# Patient Record
Sex: Female | Born: 1955
Health system: Southern US, Community
[De-identification: ages and names within clinical notes are randomized; demographics above are authoritative.]

## PROBLEM LIST (undated history)

## (undated) ENCOUNTER — Emergency Department (HOSPITAL_COMMUNITY): Admission: EM | Payer: 59

## (undated) DIAGNOSIS — J45909 Unspecified asthma, uncomplicated: Secondary | ICD-10-CM

## (undated) DIAGNOSIS — C569 Malignant neoplasm of unspecified ovary: Secondary | ICD-10-CM

## (undated) HISTORY — DX: Unspecified asthma, uncomplicated: J45.909

## (undated) HISTORY — PX: ABDOMINAL HYSTERECTOMY: SHX81

## (undated) HISTORY — PX: BREAST LUMPECTOMY: SHX2

## (undated) HISTORY — DX: Malignant neoplasm of unspecified ovary: C56.9

---

## 1997-08-19 HISTORY — PX: PARTIAL HYSTERECTOMY: SHX80

## 1998-01-26 ENCOUNTER — Ambulatory Visit (HOSPITAL_COMMUNITY): Admission: RE | Admit: 1998-01-26 | Discharge: 1998-01-26 | Payer: Self-pay | Admitting: Emergency Medicine

## 1998-03-01 ENCOUNTER — Other Ambulatory Visit: Admission: RE | Admit: 1998-03-01 | Discharge: 1998-03-01 | Payer: Self-pay | Admitting: Obstetrics and Gynecology

## 1998-03-02 ENCOUNTER — Ambulatory Visit (HOSPITAL_COMMUNITY): Admission: RE | Admit: 1998-03-02 | Discharge: 1998-03-02 | Payer: Self-pay | Admitting: Obstetrics and Gynecology

## 1998-03-23 ENCOUNTER — Inpatient Hospital Stay (HOSPITAL_COMMUNITY): Admission: RE | Admit: 1998-03-23 | Discharge: 1998-03-26 | Payer: Self-pay | Admitting: Obstetrics and Gynecology

## 1999-03-29 ENCOUNTER — Other Ambulatory Visit: Admission: RE | Admit: 1999-03-29 | Discharge: 1999-03-29 | Payer: Self-pay | Admitting: Obstetrics and Gynecology

## 1999-03-30 ENCOUNTER — Encounter (INDEPENDENT_AMBULATORY_CARE_PROVIDER_SITE_OTHER): Payer: Self-pay

## 1999-03-30 ENCOUNTER — Other Ambulatory Visit: Admission: RE | Admit: 1999-03-30 | Discharge: 1999-03-30 | Payer: Self-pay | Admitting: Obstetrics and Gynecology

## 2008-09-03 ENCOUNTER — Emergency Department (HOSPITAL_COMMUNITY): Admission: EM | Admit: 2008-09-03 | Discharge: 2008-09-03 | Payer: Self-pay | Admitting: Family Medicine

## 2009-03-30 ENCOUNTER — Emergency Department (HOSPITAL_COMMUNITY): Admission: EM | Admit: 2009-03-30 | Discharge: 2009-03-30 | Payer: Self-pay | Admitting: *Deleted

## 2009-05-30 LAB — HM COLONOSCOPY

## 2018-09-17 ENCOUNTER — Ambulatory Visit: Payer: Self-pay | Admitting: Family Medicine

## 2019-07-24 ENCOUNTER — Other Ambulatory Visit: Payer: Self-pay

## 2019-07-24 ENCOUNTER — Ambulatory Visit
Admission: EM | Admit: 2019-07-24 | Discharge: 2019-07-24 | Disposition: A | Discharge status: Home / Self Care | Payer: 59

## 2019-07-24 ENCOUNTER — Encounter: Payer: Self-pay | Admitting: Emergency Medicine

## 2019-07-24 DIAGNOSIS — R05 Cough: Secondary | ICD-10-CM

## 2019-07-24 DIAGNOSIS — R030 Elevated blood-pressure reading, without diagnosis of hypertension: Secondary | ICD-10-CM | POA: Diagnosis not present

## 2019-07-24 DIAGNOSIS — R053 Chronic cough: Secondary | ICD-10-CM

## 2019-07-24 NOTE — ED Provider Notes (Signed)
[facility]    CSN: [id number] Arrival date & time: 07/24/19  0901      History   Chief Complaint Chief Complaint  Patient presents with  . Cough    HPI Rhonda Kirk is a 63 y.o. 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  Patient states is slowly been worsening with the weather change.  Patient does endorse history of seasonal allergies: Not currently taking anything for this.  Patient denies antihypertensive use, change in medications or lifestyle.  Patient does state that there has been mold in the building that they have taking care of numerous times.  Patient does wear a mask while at work.   Past Medical History:  Diagnosis Date  . Cancer ([place])    ovarian    There are no active problems to display for this patient.   Past Surgical History:  Procedure Laterality Date  . ABDOMINAL HYSTERECTOMY    . BREAST LUMPECTOMY      OB History   No obstetric history on file.      Home Medications    Prior to Admission medications   Not on File    Family History Family History  Problem Relation Age of Onset  . Hypertension Mother   . Diabetes Father     Social History Social History   Tobacco Use  . Smoking status: Never Smoker  . Smokeless tobacco: Never Used  Substance Use Topics  . Alcohol use: Never    Frequency: Never  . Drug use: Never     Allergies   Shellfish allergy and Benadryl [diphenhydramine]   Review of Systems Review of Systems  Constitutional: Negative for fatigue and fever.  HENT: Negative for ear pain, sinus pain, sore throat and voice change.   Eyes: Negative for pain, redness and visual disturbance.  Respiratory: Positive for cough. Negative for chest tightness, shortness of breath, wheezing and stridor.   Cardiovascular: Negative for chest pain and palpitations.  Gastrointestinal: Negative for abdominal pain, diarrhea and vomiting.   Musculoskeletal: Negative for arthralgias and myalgias.  Skin: Negative for rash and wound.  Neurological: Negative for syncope and headaches.     Physical Exam Triage Vital Signs ED Triage Vitals  Enc Vitals Group     BP 07/24/19 0918 (!) 173/98     Pulse Rate 07/24/19 0918 72     Resp 07/24/19 0918 18     Temp 07/24/19 0918 (!) 97.3 F (36.3 C)     Temp Source 07/24/19 0918 Temporal     SpO2 07/24/19 0918 96 %     Weight --      Height --      Head Circumference --      Peak Flow --      Pain Score 07/24/19 0919 2     Pain Loc --      Pain Edu? --      Excl. in [place]? --    No data found.  Updated Vital Signs BP (!) 158/93 (BP Location: Left Arm)   Pulse 72   Temp (!) 97.3 F (36.3 C) (Temporal)   Resp 18   SpO2 96%   Visual Acuity Right Eye Distance:   Left Eye Distance:   Bilateral Distance:    Right Eye Near:   Left Eye Near:    Bilateral Near:     Physical Exam Constitutional:      General: She is not  in acute distress.    Appearance: She is obese. She is not ill-appearing.  HENT:     Head: Normocephalic and atraumatic.     Mouth/Throat:     Mouth: Mucous membranes are moist.     Pharynx: Oropharynx is clear. No oropharyngeal exudate or posterior oropharyngeal erythema.  Eyes:     General: No scleral icterus.    Conjunctiva/sclera: Conjunctivae normal.     Pupils: Pupils are equal, round, and reactive to light.  Neck:     Musculoskeletal: No muscular tenderness.     Comments: Trachea midline, negative JVD Cardiovascular:     Rate and Rhythm: Normal rate and regular rhythm.     Heart sounds: No murmur. No gallop.   Pulmonary:     Effort: Pulmonary effort is normal. No respiratory distress.     Breath sounds: No wheezing, rhonchi or rales.     Comments: Good air entry bilaterally without prolonged expiratory phase Musculoskeletal:     Right lower leg: No edema.     Left lower leg: No edema.  Lymphadenopathy:     Cervical: No cervical  adenopathy.  Skin:    Capillary Refill: Capillary refill takes less than 2 seconds.     Coloration: Skin is not jaundiced or pale.  Neurological:     General: No focal deficit present.     Mental Status: She is alert and oriented to person, place, and time.      UC Treatments / Results  Labs (all labs ordered are listed, but only abnormal results are displayed) Labs Reviewed - No data to display  EKG   Radiology No results found.  Procedures Procedures (including critical care time)  Medications Ordered in UC Medications - No data to display  Initial Impression / Assessment and Plan / UC Course  I have reviewed the triage vital signs and the nursing notes.  Pertinent labs & imaging results that were available during my care of the patient were reviewed by me and considered in my medical decision making (see chart for details).     Patient afebrile, nontoxic.  Low concern for systemic fungal infection at this time.  Reviewed trigger avoidance: Patient to continue working with her employer regarding mold removal.  Patient to continue mask use, add daily antihistamine.  Patient hypertensive in office without change in vision, chest pain, shortness of breath, severe abdominal pain.  Patient has follow-up in January with PCP, will discuss progress and blood pressure at that time.  Return precautions discussed, patient verbalized understanding and is agreeable to plan. Final Clinical Impressions(s) / UC Diagnoses   Final diagnoses:  Elevated blood pressure reading in office without diagnosis of hypertension  Chronic cough     Discharge Instructions     Take daytime allergy medication every day. Continue monitoring symptoms and follow-up with PCP. Return for worsening cough, productive cough, chest pain, fever.    ED Prescriptions    None     PDMP not reviewed this encounter.   Hall[place]Potvin, [place], [place] 07/24/19 1015

## 2019-07-24 NOTE — Discharge Instructions (Signed)
Take daytime allergy medication every day. Continue monitoring symptoms and follow-up with PCP. Return for worsening cough, productive cough, chest pain, fever.

## 2019-07-24 NOTE — ED Triage Notes (Signed)
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  Coughs shortly after entering the building, and seems to improve when she goes home.  States the building has mold.

## 2019-07-24 NOTE — ED Notes (Signed)
Patient able to ambulate independently  

## 2019-08-20 HISTORY — PX: COLONOSCOPY: SHX174

## 2019-08-20 HISTORY — PX: DIAGNOSTIC MAMMOGRAM: HXRAD719

## 2019-08-25 ENCOUNTER — Ambulatory Visit (INDEPENDENT_AMBULATORY_CARE_PROVIDER_SITE_OTHER): Payer: 59 | Admitting: Nurse Practitioner

## 2019-08-25 ENCOUNTER — Other Ambulatory Visit: Payer: Self-pay

## 2019-08-25 ENCOUNTER — Other Ambulatory Visit: Payer: 59

## 2019-08-25 ENCOUNTER — Encounter: Payer: Self-pay | Admitting: Nurse Practitioner

## 2019-08-25 DIAGNOSIS — R053 Chronic cough: Secondary | ICD-10-CM

## 2019-08-25 DIAGNOSIS — R05 Cough: Secondary | ICD-10-CM | POA: Diagnosis not present

## 2019-08-25 DIAGNOSIS — H5789 Other specified disorders of eye and adnexa: Secondary | ICD-10-CM | POA: Diagnosis not present

## 2019-08-25 DIAGNOSIS — Z1159 Encounter for screening for other viral diseases: Secondary | ICD-10-CM

## 2019-08-25 DIAGNOSIS — Z130 Encounter for screening for diseases of the blood and blood-forming organs and certain disorders involving the immune mechanism: Secondary | ICD-10-CM

## 2019-08-25 DIAGNOSIS — Z7689 Persons encountering health services in other specified circumstances: Secondary | ICD-10-CM

## 2019-08-25 DIAGNOSIS — Z1231 Encounter for screening mammogram for malignant neoplasm of breast: Secondary | ICD-10-CM

## 2019-08-25 DIAGNOSIS — Z1329 Encounter for screening for other suspected endocrine disorder: Secondary | ICD-10-CM

## 2019-08-25 DIAGNOSIS — Z131 Encounter for screening for diabetes mellitus: Secondary | ICD-10-CM

## 2019-08-25 DIAGNOSIS — Z13220 Encounter for screening for lipoid disorders: Secondary | ICD-10-CM

## 2019-08-25 DIAGNOSIS — Z114 Encounter for screening for human immunodeficiency virus [HIV]: Secondary | ICD-10-CM

## 2019-08-25 NOTE — Progress Notes (Signed)
Patient here for previsit labs.

## 2019-08-25 NOTE — Progress Notes (Signed)
Virtual Visit via Telephone Note Due to national recommendations of social distancing due to [place] 19, telehealth visit is felt to be most appropriate for this patient at this time.  I discussed the limitations, risks, security and privacy concerns of performing an evaluation and management service by telephone and the availability of in person appointments. I also discussed with the patient that there may be a patient responsible charge related to this service. The patient expressed understanding and agreed to proceed.    I connected with Rhonda Kirk on 08/25/19  at   2:50 PM EST  EDT by telephone and verified that I am speaking with the correct person using two identifiers.   Consent I discussed the limitations, risks, security and privacy concerns of performing an evaluation and management service by telephone and the availability of in person appointments. I also discussed with the patient that there may be a patient responsible charge related to this service. The patient expressed understanding and agreed to proceed.   Location of Patient: Private Residence   Location of Provider: [place] and [employer] Office    Persons participating in Telemedicine visit: Geryl Rankins FNP-BC [place]    History of Present Illness: Telemedicine visit for: Establish Care  has a past medical history of Ovarian cancer ([place]).   Health Maintenance PAP: S/P Hysterectomy over 20 years ago Mammogram: has not had a mammogram "in years".  Colonoscopy: Declines. States she has had 2 already and does not want to repeat   Cough: Patient complains of nonproductive cough.  Symptoms began 1 year ago.  The cough is non-productive, without wheezing, dyspnea or hemoptysis and is aggravated by nothing Associated symptoms include:watery, itchy eyes. Patient does not have new pets. Patient does not have a history of asthma. Patient does have a history of environmental allergens.  Patient does not have recent travel. Patient does not have a history of smoking. Patient  does not have previous Chest X-ray. Endorses chronic cough for over one year. Was started on zyrtec which she took for 2 weeks with little relief  and then switched over to Claritin. Cough has improved however still not resolved completely.  Dry cough. She works for [employer] in a very old [employer].  States 2 offices on her floor have been "deep cleaned" for mold. Feels she may have been exposed to mold which is causing her cough.       Past Medical History:  Diagnosis Date  . Ovarian cancer [facility])     Past Surgical History:  Procedure Laterality Date  . ABDOMINAL HYSTERECTOMY    . BREAST LUMPECTOMY      Family History  Problem Relation Age of Onset  . Hypertension Mother   . Diabetes Father     Social History   Socioeconomic History  . Marital status: Married    Spouse name: Not on file  . Number of children: Not on file  . Years of education: Not on file  . Highest education level: Not on file  Occupational History  . Not on file  Tobacco Use  . Smoking status: Never Smoker  . Smokeless tobacco: Never Used  Substance and Sexual Activity  . Alcohol use: Never  . Drug use: Never  . Sexual activity: Not on file  Other Topics Concern  . Not on file  Social History Narrative  . Not on file   Social Determinants of Health   Financial Resource Strain:   . Difficulty of Paying Living  Expenses: Not on file  Food Insecurity:   . Worried About Charity fundraiser in the Last Year: Not on file  . Ran Out of Food in the Last Year: Not on file  Transportation Needs:   . Lack of Transportation (Medical): Not on file  . Lack of Transportation (Non-Medical): Not on file  Physical Activity:   . Days of Exercise per Week: Not on file  . Minutes of Exercise per Session: Not on file  Stress:   . Feeling of Stress : Not on file  Social Connections:   . Frequency of Communication with Friends  and Family: Not on file  . Frequency of Social Gatherings with Friends and Family: Not on file  . Attends Religious Services: Not on file  . Active Member of Clubs or Organizations: Not on file  . Attends Archivist Meetings: Not on file  . Marital Status: Not on file     Observations/Objective: Awake, alert and oriented x 3   Review of Systems  Constitutional: Negative for fever, malaise/fatigue and weight loss.  HENT: Negative.  Negative for nosebleeds.   Eyes: Negative.  Negative for blurred vision, double vision and photophobia.  Respiratory: Positive for cough. Negative for shortness of breath.   Cardiovascular: Negative.  Negative for chest pain, palpitations and leg swelling.  Gastrointestinal: Negative.  Negative for heartburn, nausea and vomiting.  Musculoskeletal: Negative.  Negative for myalgias.  Neurological: Negative.  Negative for dizziness, focal weakness, seizures and headaches.  Endo/Heme/Allergies: Positive for environmental allergies.  Psychiatric/Behavioral: Negative.  Negative for suicidal ideas.    Assessment and Plan: Rhonda Kirk was seen today for establish care and cough.  Diagnoses and all orders for this visit:  Encounter to establish care  Chronic cough -     DG Chest 2 View; Future If xray neg Work up for GERD  if no response Treat as asthma and refer to pulmonology for PFT  Breast cancer screening by mammogram -     Correct MAMMOGRAM; Future     Follow Up Instructions Return if symptoms worsen or fail to improve.     I discussed the assessment and treatment plan with the patient. The patient was provided an opportunity to ask questions and all were answered. The patient agreed with the plan and demonstrated an understanding of the instructions.   The patient was advised to call back or seek an in-person evaluation if the symptoms worsen or if the condition fails to improve as anticipated.  I provided 20 minutes of  non-face-to-face time during this encounter including median intraservice time, reviewing previous notes, labs, imaging, medications and explaining diagnosis and management.  Rhonda Pounds, FNP-BC

## 2019-08-26 LAB — CBC WITH DIFFERENTIAL/PLATELET
Basophils Absolute: 0 10*3/uL (ref 0.0–0.2)
Basos: 0 %
EOS (ABSOLUTE): 0.1 10*3/uL (ref 0.0–0.4)
Eos: 2 %
Hematocrit: 40.7 % (ref 34.0–46.6)
Hemoglobin: 13.7 g/dL (ref 11.1–15.9)
Immature Grans (Abs): 0 10*3/uL (ref 0.0–0.1)
Immature Granulocytes: 0 %
Lymphocytes Absolute: 1.8 10*3/uL (ref 0.7–3.1)
Lymphs: 34 %
MCH: 29.1 pg (ref 26.6–33.0)
MCHC: 33.7 g/dL (ref 31.5–35.7)
MCV: 86 fL (ref 79–97)
Monocytes Absolute: 0.2 10*3/uL (ref 0.1–0.9)
Monocytes: 5 %
Neutrophils Absolute: 3 10*3/uL (ref 1.4–7.0)
Neutrophils: 59 %
Platelets: 199 10*3/uL (ref 150–450)
RBC: 4.71 x10E6/uL (ref 3.77–5.28)
RDW: 12.5 % (ref 11.7–15.4)
WBC: 5.2 10*3/uL (ref 3.4–10.8)

## 2019-08-26 LAB — COMPREHENSIVE METABOLIC PANEL
ALT: 13 IU/L (ref 0–32)
AST: 19 IU/L (ref 0–40)
Albumin/Globulin Ratio: 1.6 (ref 1.2–2.2)
Albumin: 4.6 g/dL (ref 3.8–4.8)
Alkaline Phosphatase: 85 IU/L (ref 39–117)
BUN/Creatinine Ratio: 10 — ABNORMAL LOW (ref 12–28)
BUN: 8 mg/dL (ref 8–27)
Bilirubin Total: 0.3 mg/dL (ref 0.0–1.2)
CO2: 26 mmol/L (ref 20–29)
Calcium: 9.4 mg/dL (ref 8.7–10.3)
Chloride: 104 mmol/L (ref 96–106)
Creatinine, Ser: 0.82 mg/dL (ref 0.57–1.00)
GFR calc Af Amer: 89 mL/min/{1.73_m2} (ref >59)
GFR calc non Af Amer: 77 mL/min/{1.73_m2} (ref >59)
Globulin, Total: 2.8 g/dL (ref 1.5–4.5)
Glucose: 101 mg/dL — ABNORMAL HIGH (ref 65–99)
Potassium: 4.4 mmol/L (ref 3.5–5.2)
Sodium: 143 mmol/L (ref 134–144)
Total Protein: 7.4 g/dL (ref 6.0–8.5)

## 2019-08-26 LAB — HEPATITIS C ANTIBODY: Hep C Virus Ab: <0.1 s/co ratio (ref 0.0–0.9)

## 2019-08-26 LAB — LIPID PANEL
Chol/HDL Ratio: 4.2 ratio (ref 0.0–4.4)
Cholesterol, Total: 244 mg/dL — ABNORMAL HIGH (ref 100–199)
HDL: 58 mg/dL (ref >39)
LDL Chol Calc (NIH): 171 mg/dL — ABNORMAL HIGH (ref 0–99)
Triglycerides: 87 mg/dL (ref 0–149)
VLDL Cholesterol Cal: 15 mg/dL (ref 5–40)

## 2019-08-26 LAB — HEMOGLOBIN A1C
Est. average glucose Bld gHb Est-mCnc: 126 mg/dL
Hgb A1c MFr Bld: 6 % — ABNORMAL HIGH (ref 4.8–5.6)

## 2019-08-26 LAB — HIV ANTIBODY (ROUTINE TESTING W REFLEX): HIV Screen 4th Generation wRfx: NONREACTIVE

## 2019-08-26 LAB — TSH: TSH: 2.23 u[IU]/mL (ref 0.450–4.500)

## 2019-08-30 ENCOUNTER — Ambulatory Visit (HOSPITAL_COMMUNITY)
Admission: RE | Admit: 2019-08-30 | Discharge: 2019-08-30 | Disposition: A | Discharge status: Home / Self Care | Payer: 59 | Source: Ambulatory Visit | Attending: Nurse Practitioner | Admitting: Nurse Practitioner

## 2019-08-30 ENCOUNTER — Emergency Department (HOSPITAL_COMMUNITY): Admission: EM | Admit: 2019-08-30 | Discharge: 2019-08-30 | Discharge status: Absent without leave | Payer: 59

## 2019-08-30 DIAGNOSIS — R05 Cough: Secondary | ICD-10-CM | POA: Insufficient documentation

## 2019-08-30 DIAGNOSIS — R053 Chronic cough: Secondary | ICD-10-CM

## 2019-08-30 DIAGNOSIS — R918 Other nonspecific abnormal finding of lung field: Secondary | ICD-10-CM | POA: Diagnosis not present

## 2019-08-31 ENCOUNTER — Encounter: Payer: Self-pay | Admitting: Nurse Practitioner

## 2019-08-31 ENCOUNTER — Other Ambulatory Visit: Payer: Self-pay | Admitting: Nurse Practitioner

## 2019-08-31 DIAGNOSIS — R9389 Abnormal findings on diagnostic imaging of other specified body structures: Secondary | ICD-10-CM

## 2019-09-06 ENCOUNTER — Other Ambulatory Visit: Payer: Self-pay

## 2019-09-06 ENCOUNTER — Ambulatory Visit (HOSPITAL_COMMUNITY)
Admission: RE | Admit: 2019-09-06 | Discharge: 2019-09-06 | Disposition: A | Discharge status: Home / Self Care | Payer: 59 | Source: Ambulatory Visit | Attending: Nurse Practitioner | Admitting: Nurse Practitioner

## 2019-09-06 DIAGNOSIS — R9389 Abnormal findings on diagnostic imaging of other specified body structures: Secondary | ICD-10-CM | POA: Insufficient documentation

## 2019-10-20 ENCOUNTER — Encounter: Payer: Self-pay | Admitting: Nurse Practitioner

## 2019-10-20 ENCOUNTER — Ambulatory Visit
Admission: RE | Admit: 2019-10-20 | Discharge: 2019-10-20 | Disposition: A | Discharge status: Home / Self Care | Payer: 59 | Source: Ambulatory Visit | Attending: Nurse Practitioner | Admitting: Nurse Practitioner

## 2019-10-20 ENCOUNTER — Other Ambulatory Visit: Payer: Self-pay

## 2019-10-20 ENCOUNTER — Other Ambulatory Visit: Payer: Self-pay | Admitting: Nurse Practitioner

## 2019-10-20 DIAGNOSIS — Z1231 Encounter for screening mammogram for malignant neoplasm of breast: Secondary | ICD-10-CM

## 2019-10-21 ENCOUNTER — Encounter: Payer: Self-pay | Admitting: Nurse Practitioner

## 2020-01-11 ENCOUNTER — Encounter: Payer: Self-pay | Admitting: Nurse Practitioner

## 2020-01-18 ENCOUNTER — Telehealth (INDEPENDENT_AMBULATORY_CARE_PROVIDER_SITE_OTHER): Payer: 59 | Admitting: Internal Medicine

## 2020-01-18 ENCOUNTER — Encounter: Payer: Self-pay | Admitting: Internal Medicine

## 2020-01-18 DIAGNOSIS — Z1211 Encounter for screening for malignant neoplasm of colon: Secondary | ICD-10-CM | POA: Diagnosis not present

## 2020-01-18 DIAGNOSIS — R058 Other specified cough: Secondary | ICD-10-CM

## 2020-01-18 DIAGNOSIS — R05 Cough: Secondary | ICD-10-CM

## 2020-01-18 MED ORDER — ALBUTEROL SULFATE (2.5 MG/3ML) 0.083% IN NEBU: 2.5000 mg | INHALATION_SOLUTION | Freq: Four times a day (QID) | RESPIRATORY_TRACT | 1 refills | Status: DC | PRN

## 2020-01-18 MED ORDER — MONTELUKAST SODIUM 10 MG PO TABS: 10.0000 mg | ORAL_TABLET | Freq: Every day | ORAL | 3 refills | Status: DC

## 2020-01-18 NOTE — Patient Instructions (Signed)
Thank you for choosing Primary Care at [facility] to be your medical home!    Rhonda Kirk was seen by Melina Schools, DO today.   [name] primary care provider is Phill Myron, DO.   For the best care possible, you should try to see Phill Myron, DO whenever you come to the clinic.   We look forward to seeing you again soon!  If you have any questions about your visit today, please call us at [phone] or feel free to reach your primary care provider via [place].

## 2020-01-18 NOTE — Progress Notes (Signed)
Virtual Visit via Telephone Note  I connected with Rhonda Kirk, on 01/18/2020 at 10:34 AM by telephone due to the COVID-19 pandemic and verified that I am speaking with the correct person using two identifiers.   Consent: I discussed the limitations, risks, security and privacy concerns of performing an evaluation and management service by telephone and the availability of in person appointments. I also discussed with the patient that there may be a patient responsible charge related to this service. The patient expressed understanding and agreed to proceed.   Location of Patient: Home   Location of Provider: Clinic    Persons participating in Telemedicine visit: Neda Quintera [facility]le [facility] Dr. Juleen China      History of Present Illness: Patient has a chronic dry cough. She has been seen for this in Jan 2021 by Geryl Rankins, NP. Patient reports she was sure it was not acid reflux as her husband has this and she has no similar symptoms. She did try acid reflux diet for two weeks with no changes.   This all started when she started a new job 1.5 years ago. Noticed that when she walked into work she would start coughing. Reports there is mold in the building and she has coworkers that have the same symptoms. She is convinced it is related to this environmental exposure. If she goes into an office with a door it will cause her to start coughing. Her symptoms are much better when she is not in the office but do seem to be worsening when she is outside of the work place now.   She used to take Zyrtec but is now taking Claritin. Has never used an inhaler.   She has never been a smoker. Does not cough up any phlegm. Denies hemoptysis, unintentional weight changes, night sweats, fevers.    Past Medical History:  Diagnosis Date  . Ovarian cancer ([facility])    Allergies  Allergen Reactions  . Shellfish Allergy Anaphylaxis  . Benadryl [Diphenhydramine] Hives    Current Outpatient  Medications on File Prior to Visit  Medication Sig Dispense Refill  . loratadine (CLARITIN) 10 MG tablet Take 10 mg by mouth daily.    . Omega-3 Fatty Acids (FISH OIL) 1000 MG CAPS Take 2 capsules by mouth daily.     No current facility-administered medications on file prior to visit.    Observations/Objective: NAD. Speaking clearly.  Work of breathing normal.  Alert and oriented. Mood appropriate.   Assessment and Plan: 1. Nonproductive cough Certainly may be related to environmental trigger with mold exposure. Will treat with Singulair in attempt to better control allergy symptoms and give Rx for rescue inhaler as well. She does not have any known underlying lung disease and has never been a smoker, making this less likely. However, may have component of reactive asthma as well. Will refer to allergy and asthma clinic. Reviewed that she had a negative CXR in Jan 2021 ordered due to this same complaint. She has no red flag symptoms for malignancy. She is not taking an Ace inhibitor or another offending medication.  - albuterol (PROVENTIL) (2.5 MG/3ML) 0.083% nebulizer solution; Take 3 mLs (2.5 mg total) by nebulization every 6 (six) hours as needed for wheezing or shortness of breath.  Dispense: 150 mL; Refill: 1 - montelukast (SINGULAIR) 10 MG tablet; Take 1 tablet (10 mg total) by mouth at bedtime.  Dispense: 30 tablet; Refill: 3 - Ambulatory referral to Allergy  2. Colon cancer screening - Ambulatory referral to Gastroenterology  Follow Up Instructions: Allergy specialist for consultation and with PCP for routine medical care    I discussed the assessment and treatment plan with the patient. The patient was provided an opportunity to ask questions and all were answered. The patient agreed with the plan and demonstrated an understanding of the instructions.   The patient was advised to call back or seek an in-person evaluation if the symptoms worsen or if the condition fails to  improve as anticipated.     I provided 22 minutes total of non-face-to-face time during this encounter including median intraservice time, reviewing previous notes, investigations, ordering medications, medical decision making, coordinating care and patient verbalized understanding at the end of the visit.    Phill Myron, D.O. Primary Care at [facility]  01/18/2020, 10:34 AM

## 2020-01-31 ENCOUNTER — Other Ambulatory Visit: Payer: Self-pay | Admitting: Internal Medicine

## 2020-01-31 DIAGNOSIS — R053 Chronic cough: Secondary | ICD-10-CM

## 2020-02-22 LAB — COLOGUARD: Cologuard: NEGATIVE

## 2020-03-02 ENCOUNTER — Ambulatory Visit: Payer: 59 | Admitting: Allergy & Immunology

## 2020-03-02 ENCOUNTER — Encounter: Payer: Self-pay | Admitting: Allergy & Immunology

## 2020-03-02 ENCOUNTER — Other Ambulatory Visit: Payer: Self-pay

## 2020-03-02 VITALS — BP 124/82 | HR 84 | Temp 98.3°F | Resp 16 | Ht 66.0 in | Wt 194.0 lb

## 2020-03-02 DIAGNOSIS — R05 Cough: Secondary | ICD-10-CM | POA: Diagnosis not present

## 2020-03-02 DIAGNOSIS — J310 Chronic rhinitis: Secondary | ICD-10-CM

## 2020-03-02 DIAGNOSIS — R059 Cough, unspecified: Secondary | ICD-10-CM

## 2020-03-02 NOTE — Progress Notes (Signed)
NEW PATIENT  Date of Service/Encounter:  03/02/20  Referring provider: Nicolette Bang, DO   Assessment:   Cough  Non-allergic rhinitis   It is rather unclear why Rhonda Kirk is having coughing in Rhonda Kirk workplace, but patient asthma is certainly a consideration.  Rhonda Kirk does not have any sensitization to any environmental allergens based on the testing today, but I did tell Rhonda Kirk that even nonallergic patients will start to have allergic-like symptoms when environmental allergens reach a certain concentration.  This is more of an irritant reaction, but is clearly still triggered by work environment.  Rhonda Kirk is rather averse to medications, but I did get Rhonda Kirk to agree to use an albuterol inhaler to see if that helps during Rhonda Kirk acute episodes.  This particular inhaler, has a counter and a device that measures inhalation technique. I think that this will be helpful with regards to monitoring how and when Rhonda Kirk is using this device.   Plan/Recommendations:   1. Cough - Lung testing looked good and it did not really change too much with the albuterol treatment. - However, one part of your lung function did improve (FEF 25-75%), so this might be relevant.  - This points towards a diagnosis of asthma, but does not necessarily rule it in. - Given this finding as well as your symptoms, I think we are going to tentatively make a diagnosis of asthma. - We are going to start you on a an albuterol inhaler to see how much you are needing it throughout the day AND to see if this helps your symptoms at all: ProAir Digihaler (sample provided). - There is an app you can download to help you keep track of how often you are using it AND help you to be able to realize whether you are using correct inhalation technique (scan the QR code on the box) - Daily controller medication(s): NOTHING (at least not yet) - Rescue medications: ProAir Digihaler 4 puffs every 4-6 hours as needed - Asthma control goals:  * Full  participation in all desired activities (may need albuterol before activity) * Albuterol use two time or less a week on average (not counting use with activity) * Cough interfering with sleep two time or less a month * Oral steroids no more than once a year * No hospitalizations  2. Chronic rhinitis - Testing today showed: negative to the entire panel - Copy of test results provided.  - However, I do not think you are crazy!  - Anyone would have coughing fits when exposed to a certain threshold of mold spores, even if they are not allergic.  - I do not think that any allergy medication is needed at this time, but I am open to revisting this in the future if needed (i.e. your postnasal drip becomes more of an issue, etc).   3. Return in about 4 weeks (around 03/30/2020).   Subjective:   Rhonda Kirk is a 64 y.o. female presenting today for evaluation of  Chief Complaint  Patient presents with  . Cough  . Allergic Rhinitis     Rhonda Kirk has a history of the following: Patient Active Problem List   Diagnosis Date Noted  . Cough 03/02/2020  . Non-allergic rhinitis 03/02/2020    History obtained from: chart review and patient.  Rhonda Kirk was referred by Nicolette Bang, DO.     Rhonda Kirk is a 64 y.o. female presenting for an evaluation of coughing and allergies.  Rhonda Kirk reports that this can be  a deep cough and sometimes productive. There is some mold in Rhonda Kirk workplace which is how this all started. Rhonda Kirk started this job two years ago. Rhonda Kirk works at [employer] down the street. Rhonda Kirk is fine when Rhonda Kirk goes home on the weekends for the most part. But Rhonda Kirk will have some coughing on the weekends. Rhonda Kirk has noticed that Rhonda Kirk symptoms are more consistent. Rhonda Kirk does not notice a seasonal variation of the symptoms. Rhonda Kirk knows that when Rhonda Kirk is in the building for a long period of time, it is more consistent in nature. Afternoons or evenings are the worse time of the  year. Rhonda Kirk does cough at night.  Rhonda Kirk PCP did prescribe albuterol but this was a nebulizer solution. Rhonda Kirk has never had a nebulizer however and Rhonda Kirk has never been able to use it. This albuterol was prescribed last month.  Rhonda Kirk does have some emesis and allergy symptoms with exposure to shellfish. There are coworkers that microwave seafood for lunch and Rhonda Kirk ends up throwing up when smelling this. Rhonda Kirk can eat fin fish without a problem.   Rhonda Kirk does report allergic rhinitis symptoms which have worsened since Rhonda Kirk started working in this building. Rhonda Kirk has never been tested in the past and Rhonda Kirk did not have allergic rhinitise symptoms when Rhonda Kirk was very young. Rhonda Kirk would have cough and wheezing during childhood when he mother was a cigarette smoker. Rhonda Kirk did Rhonda Kirk best to avoid the kids but Rhonda Kirk eventually even stopped smoking.   Otherwise, there is no history of other atopic diseases, including food allergies, drug allergies, stinging insect allergies, eczema, urticaria or contact dermatitis. There is no significant infectious history. Vaccinations are up to date.    Past Medical History: Patient Active Problem List   Diagnosis Date Noted  . Cough 03/02/2020  . Non-allergic rhinitis 03/02/2020    Medication List:  Allergies as of 03/02/2020      Reactions   Shellfish Allergy Anaphylaxis   Benadryl [diphenhydramine] Hives      Medication List       Accurate as of March 02, 2020  5:13 PM. If you have any questions, ask your nurse or doctor.        albuterol (2.5 MG/3ML) 0.083% nebulizer solution Commonly known as: PROVENTIL Take 3 mLs (2.5 mg total) by nebulization every 6 (six) hours as needed for wheezing or shortness of breath.   Fish Oil 1000 MG Caps Take 2 capsules by mouth daily.   loratadine 10 MG tablet Commonly known as: CLARITIN Take 10 mg by mouth daily.   montelukast 10 MG tablet Commonly known as: SINGULAIR Take 1 tablet (10 mg total) by mouth at bedtime.       Birth  History: non-contributory  Developmental History: non-contributory  Past Surgical History: Past Surgical History:  Procedure Laterality Date  . ABDOMINAL HYSTERECTOMY    . BREAST LUMPECTOMY       Family History: Family History  Problem Relation Age of Onset  . Hypertension Mother   . Diabetes Mother   . Diabetes Father   . Diabetes Sister   . Hypertension Sister   . Kidney disease Sister        Double mastectomy     Social History: Maybell lives in a house that is [address] years old.  Rhonda Kirk has carpeting throughout the home.  He has gas heating and central cooling.  There are neighbor cats and dogs around the home, but not inside of the home.  Rhonda Kirk does not  have dust mite covers on Rhonda Kirk bedding.  There is no tobacco exposure.  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  He is exposed to fumes, chemicals, and dust in Rhonda Kirk work environment.  Rhonda Kirk reports that Rhonda Kirk workplace is quite moldy and dusty.   Review of Systems  Constitutional: Negative.  Negative for chills, fever, malaise/fatigue and weight loss.  HENT: Negative.  Negative for congestion, ear discharge, ear pain, sinus pain and sore throat.   Eyes: Negative for pain, discharge and redness.  Respiratory: Negative for cough, sputum production, shortness of breath and wheezing.   Cardiovascular: Negative.  Negative for chest pain and palpitations.  Gastrointestinal: Negative for abdominal pain, constipation, diarrhea, heartburn, nausea and vomiting.  Skin: Negative.  Negative for itching and rash.  Neurological: Negative for dizziness and headaches.  Endo/Heme/Allergies: Negative for environmental allergies. Does not bruise/bleed easily.       Objective:   Blood pressure 124/82, pulse 84, temperature 98.3 F (36.8 C), temperature source Temporal, resp. rate 16, height 5\' 6"  (1.676 m), weight 194 lb (88 kg), SpO2 98 %. Body  mass index is 31.31 kg/m.   Physical Exam:   Physical Exam Constitutional:      Appearance: Rhonda Kirk is well-developed.     Comments: Pleasant female. Very talkative.   HENT:     Head: Normocephalic and atraumatic.     Right Ear: Tympanic membrane, ear canal and external ear normal. No drainage, swelling or tenderness. Tympanic membrane is not injected, scarred, erythematous, retracted or bulging.     Left Ear: Tympanic membrane, ear canal and external ear normal. No drainage, swelling or tenderness. Tympanic membrane is not injected, scarred, erythematous, retracted or bulging.     Nose: No nasal deformity, septal deviation, mucosal edema or rhinorrhea.     Right Turbinates: Enlarged and pale.     Left Turbinates: Enlarged and pale.     Right Sinus: No maxillary sinus tenderness or frontal sinus tenderness.     Left Sinus: No maxillary sinus tenderness or frontal sinus tenderness.     Mouth/Throat:     Mouth: Mucous membranes are not pale and not dry.     Pharynx: Uvula midline.     Comments: Cobblestoning present in the posterior oropharynx.  Eyes:     General:        Right eye: No discharge.        Left eye: No discharge.     Conjunctiva/sclera: Conjunctivae normal.     Right eye: Right conjunctiva is not injected. No chemosis.    Left eye: Left conjunctiva is not injected. No chemosis.    Pupils: Pupils are equal, round, and reactive to light.  Cardiovascular:     Rate and Rhythm: Normal rate and regular rhythm.     Heart sounds: Normal heart sounds.  Pulmonary:     Effort: Pulmonary effort is normal. No tachypnea, accessory muscle usage or respiratory distress.     Breath sounds: Normal breath sounds. No wheezing, rhonchi or rales.     Comments: Moving air well in all lung fields. No increased work of breathing noted.  Chest:     Chest wall: No tenderness.  Abdominal:     Tenderness: There is no abdominal tenderness. There is no guarding or rebound.  Lymphadenopathy:      Head:     Right side of head: No submandibular, tonsillar or occipital  adenopathy.     Left side of head: No submandibular, tonsillar or occipital adenopathy.     Cervical: No cervical adenopathy.  Skin:    Coloration: Skin is not pale.     Findings: No abrasion, erythema, petechiae or rash. Rash is not papular, urticarial or vesicular.  Neurological:     Mental Status: Rhonda Kirk is alert.      Diagnostic studies:    Spirometry: results normal (FEV1: 1.95/87%, FVC: 2.45/86%, FEV1/FVC: 80%).    Spirometry consistent with normal pattern. Albuterol four puffs via MDI treatment given in clinic with no improvement.  However, Rhonda Kirk FEF 25-75% did improve 30%.  Allergy Studies:     Airborne Adult Perc - 03/02/20 1422    Time Antigen Placed 1422    Allergen Manufacturer Lavella Hammock    Location Back    Number of Test 59    Panel 1 Select    1. Control-Buffer 50% Glycerol Negative    2. Control-Histamine 1 mg/ml 2+    3. Albumin saline Negative    4. [place] Negative    5. [place] Negative    6. Johnson Negative    7. Buncombe Blue Negative    8. Meadow Fescue Negative    9. Perennial Rye Negative    10. Sweet Vernal Negative    11. Timothy Negative    12. Cocklebur Negative    13. Burweed Marshelder Negative    14. Ragweed, short Negative    15. Ragweed, Giant Negative    16. Plantain,  English Negative    17. Lamb's Quarters Negative    18. Sheep Sorrell Negative    19. Rough Pigweed Negative    20. Marsh Elder, Rough Negative    21. Mugwort, Common Negative    22. Ash mix Negative    23. Birch mix Negative    24. Beech American Negative    25. Box, Elder Negative    26. Cedar, red Negative    27. Cottonwood, [place] Negative    28. Elm mix Negative    29. Hickory Negative    30. Maple mix Negative    31. Oak, [place] mix Negative    32. Pecan Pollen Negative    33. Pine mix Negative    34. Sycamore Eastern Negative    35. [place], Black Pollen Negative    36. Alternaria alternata  Negative    37. Cladosporium Herbarum Negative    38. Aspergillus mix Negative    39. Penicillium mix Negative    40. Bipolaris sorokiniana (Helminthosporium) Negative    41. Drechslera spicifera (Curvularia) Negative    42. Mucor plumbeus Negative    43. Fusarium moniliforme Negative    44. Aureobasidium pullulans (pullulara) Negative    45. Rhizopus oryzae Negative    46. Botrytis cinera Negative    47. Epicoccum nigrum Negative    48. Phoma betae Negative    49. Candida Albicans Negative    50. Trichophyton mentagrophytes Negative    51. Mite, D Farinae  5,000 AU/ml Negative    52. Mite, D Pteronyssinus  5,000 AU/ml Negative    53. Cat Hair 10,000 BAU/ml Negative    54.  Dog Epithelia Negative    55. Mixed Feathers Negative    56. Horse Epithelia Negative    57. Cockroach, German Negative    58. Mouse Negative    59. Tobacco Leaf Negative          Intradermal - 03/02/20 1452    Time Antigen Placed 1452  Allergen Manufacturer Greer    Location Arm    Number of Test 15    Intradermal Select    Control Negative    Guatemala Negative    Johnson Negative    7 Grass Negative    Ragweed mix Negative    Weed mix Negative    Tree mix Negative    Mold 1 Negative    Mold 2 Negative    Mold 3 Negative    Mold 4 Negative    Cat Negative    Dog Negative    Cockroach Negative    Mite mix Negative                     Salvatore Marvel, MD [facility]

## 2020-03-02 NOTE — Patient Instructions (Addendum)
1. Cough - Lung testing looked good and it did not really change too much with the albuterol treatment. - However, one part of your lung function did improve (FEF 25-75%), so this might be relevant.  - This points towards a diagnosis of asthma, but does not necessarily rule it in. - Given this finding as well as your symptoms, I think we are going to tentatively make a diagnosis of asthma. - We are going to start you on a an albuterol inhaler to see how much you are needing it throughout the day AND to see if this helps your symptoms at all: ProAir Digihaler (sample provided). - There is an app you can download to help you keep track of how often you are using it AND help you to be able to realize whether you are using correct inhalation technique (scan the QR code on the box) - Daily controller medication(s): NOTHING (at least not yet) - Rescue medications: ProAir Digihaler 4 puffs every 4-6 hours as needed - Asthma control goals:  * Full participation in all desired activities (may need albuterol before activity) * Albuterol use two time or less a week on average (not counting use with activity) * Cough interfering with sleep two time or less a month * Oral steroids no more than once a year * No hospitalizations  2. Chronic rhinitis - Testing today showed: negative to the entire panel - Copy of test results provided.  - However, I do not think you are crazy!  - Anyone would have coughing fits when exposed to a certain threshold of mold spores, even if they are not allergic.  - I do not think that any allergy medication is needed at this time, but I am open to revisting this in the future if needed (i.e. your postnasal drip becomes more of an issue, etc).   3. Return in about 4 weeks (around 03/30/2020).    Please inform us of any Emergency Department visits, hospitalizations, or changes in symptoms. Call us before going to the ED for breathing or allergy symptoms since we might be able to  fit you in for a sick visit. Feel free to contact us anytime with any questions, problems, or concerns.  It was a pleasure to meet you today!  Websites that have reliable patient information: 1. American Academy of Asthma, Allergy, and Immunology: www.aaaai.org 2. Food Allergy Research and Education (FARE): foodallergy.org 3. Mothers of Asthmatics: http://www.asthmacommunitynetwork.org 4. American College of Allergy, Asthma, and Immunology: www.acaai.org   COVID-19 Vaccine Information can be found at: ShippingScam.co.uk For questions related to vaccine distribution or appointments, please email [email or web address] or call [phone].     "Like" [place] on Facebook and Instagram for our latest updates!        Make sure you are registered to vote! If you have moved or changed any of your contact information, you will need to get this updated before voting!  In some cases, you MAY be able to register to vote online: [email or web address]

## 2020-03-08 LAB — COLOGUARD: COLOGUARD: NEGATIVE

## 2020-03-30 ENCOUNTER — Ambulatory Visit: Payer: 59 | Admitting: Allergy & Immunology

## 2020-05-04 ENCOUNTER — Other Ambulatory Visit: Payer: Self-pay

## 2020-05-04 ENCOUNTER — Ambulatory Visit (INDEPENDENT_AMBULATORY_CARE_PROVIDER_SITE_OTHER): Payer: 59 | Admitting: Allergy & Immunology

## 2020-05-04 ENCOUNTER — Encounter: Payer: Self-pay | Admitting: Allergy & Immunology

## 2020-05-04 VITALS — BP 132/76 | HR 70 | Temp 97.7°F | Resp 18

## 2020-05-04 DIAGNOSIS — J4540 Moderate persistent asthma, uncomplicated: Secondary | ICD-10-CM

## 2020-05-04 DIAGNOSIS — J310 Chronic rhinitis: Secondary | ICD-10-CM | POA: Diagnosis not present

## 2020-05-04 MED ORDER — AIRDUO DIGIHALER 113-14 MCG/ACT IN AEPB: 1.0000 | INHALATION_SPRAY | Freq: Two times a day (BID) | RESPIRATORY_TRACT | 5 refills | Status: DC

## 2020-05-04 NOTE — Patient Instructions (Addendum)
1. Cough - Lung testing looked good today. - Letter for HR written today.  - Since you have responded so well to the albuterol, I think we need to start a daily medication to control your symptoms. - AirDuo contains a long acting albuterol and an inhaled steroid. - Copay card provided. - We did not have a sample today.  - Daily controller medication(s): AirDuo 113/63mcg one puff twice daily th - Rescue medications: ProAir Digihaler 4 puffs every 4-6 hours as needed - Asthma control goals:  * Full participation in all desired activities (may need albuterol before activity) * Albuterol use two time or less a week on average (not counting use with activity) * Cough interfering with sleep two time or less a month * Oral steroids no more than once a year * No hospitalizations  2. Return in about 3 months (around 08/03/2020).    Please inform us of any Emergency Department visits, hospitalizations, or changes in symptoms. Call us before going to the ED for breathing or allergy symptoms since we might be able to fit you in for a sick visit. Feel free to contact us anytime with any questions, problems, or concerns.  It was a pleasure to see you again today!  Websites that have reliable patient information: 1. American Academy of Asthma, Allergy, and Immunology: www.aaaai.org 2. Food Allergy Research and Education (FARE): foodallergy.org 3. Mothers of Asthmatics: http://www.asthmacommunitynetwork.org 4. American College of Allergy, Asthma, and Immunology: www.acaai.org   COVID-19 Vaccine Information can be found at: ShippingScam.co.uk For questions related to vaccine distribution or appointments, please email [email or web address] or call [phone].     "Like" [place] on Facebook and Instagram for our latest updates!        Make sure you are registered to vote! If you have moved or changed any of your contact information, you  will need to get this updated before voting!  In some cases, you MAY be able to register to vote online: [email or web address]

## 2020-05-04 NOTE — Progress Notes (Signed)
FOLLOW UP  Date of Service/Encounter:  05/04/20   Assessment:   Cough - labeling tentatively as asthma and initiating controller medication  Non-allergic rhinitis    Rhonda Kirk presents for follow-up visit.  The albuterol has been very helpful for her cough, which certainly points towards an asthma diagnosis.  At the last visit, I did not want to start a ton of medications because she seemed somewhat medication averse.  However, she has now come to the realization that she might benefit from asthma medications and is willing to take it to the next step.  We are to start her on AirDuo 113/57mcg 1 puff twice daily to see if this can decrease her episodes of coughing.  In addition, I did fill out a letter requesting some work accommodations to help decrease her exposure to her triggers. We will see her in three months to see how she is doing at that time.  Plan/Recommendations:   1. Cough - Lung testing looked good today. - Letter for HR written today.  - Since you have responded so well to the albuterol, I think we need to start a daily medication to control your symptoms. - AirDuo contains a long acting albuterol and an inhaled steroid. - Copay card provided. - We did not have a sample today.  - Daily controller medication(s): AirDuo 113/71mcg one puff twice daily th - Rescue medications: ProAir Digihaler 4 puffs every 4-6 hours as needed - Asthma control goals:  * Full participation in all desired activities (may need albuterol before activity) * Albuterol use two time or less a week on average (not counting use with activity) * Cough interfering with sleep two time or less a month * Oral steroids no more than once a year * No hospitalizations  2. Return in about 3 months (around 08/03/2020).   Subjective:   Rhonda Kirk is a 64 y.o. female presenting today for follow up of  Chief Complaint  Patient presents with  . Cough    is about the same. she has noticed that the  ProAir digihaler does calm it down some. she is unsure of the exact cause.     Rhonda Kirk has a history of the following: Patient Active Problem List   Diagnosis Date Noted  . Cough 03/02/2020  . Non-allergic rhinitis 03/02/2020    History obtained from: chart review and patient.  Rhonda Kirk is a 64 y.o. female presenting for a follow up visit. She was last seen in July 2021. At that time, her lung testing looked good but there was improvement with the albuterol. We provided a Digihaler.   Since the last visit, she has done well.   Asthma/Respiratory Symptom History: She albuterol seems to be helping the cough for a "good long while". She tells me that she gets 3+ hours relief of the coughing. She has 98 puffs left. Her app tells her about the inhalation quality and the air quality. She has found this to be helpful. She coughs at home and at work, but it definitely seems to be worse when she is at work.  She reports that this is triggered by perfumes at work as well as coworkers who cooked fish in the microwave.  She has tried talking to them and mostly they attempt to help her.  She was wondering if we could write a letter for HR so that she could get some more accommodations at work.  She is very open to starting a controller medication.  Allergic Rhinitis  Symptom History: She is not using any kind of allergy medication.  Most of her symptoms were manifested by cough and she never really felt much in the way of postnasal drip.  She has not needed antibiotics in quite some time.  Otherwise, there have been no changes to her past medical history, surgical history, family history, or social history.    Review of Systems  Constitutional: Negative.  Negative for chills, fever, malaise/fatigue and weight loss.  HENT: Negative.  Negative for congestion, ear discharge and ear pain.   Eyes: Negative for pain, discharge and redness.  Respiratory: Positive for cough. Negative for sputum  production, shortness of breath and wheezing.   Cardiovascular: Negative.  Negative for chest pain and palpitations.  Gastrointestinal: Negative for abdominal pain and heartburn.  Skin: Negative.  Negative for itching and rash.  Neurological: Negative for dizziness and headaches.  Endo/Heme/Allergies: Negative for environmental allergies. Does not bruise/bleed easily.       Objective:   Blood pressure 132/76, pulse 70, temperature 97.7 F (36.5 C), temperature source Temporal, resp. rate 18, SpO2 99 %. There is no height or weight on file to calculate BMI.   Physical Exam:  Physical Exam Constitutional:      Appearance: She is well-developed.     Comments: Very pleasant over-the-top female.  HENT:     Head: Normocephalic and atraumatic.     Right Ear: Tympanic membrane, ear canal and external ear normal.     Left Ear: Tympanic membrane, ear canal and external ear normal.     Nose: No nasal deformity, septal deviation, mucosal edema or rhinorrhea.     Right Turbinates: Enlarged and swollen.     Left Turbinates: Enlarged and swollen.     Right Sinus: No maxillary sinus tenderness or frontal sinus tenderness.     Left Sinus: No maxillary sinus tenderness or frontal sinus tenderness.     Comments: Cobblestoning present in the posterior oropharynx.    Mouth/Throat:     Mouth: Mucous membranes are not pale and not dry.     Pharynx: Uvula midline.  Eyes:     General:        Right eye: No discharge.        Left eye: No discharge.     Conjunctiva/sclera: Conjunctivae normal.     Right eye: Right conjunctiva is not injected. No chemosis.    Left eye: Left conjunctiva is not injected. No chemosis.    Pupils: Pupils are equal, round, and reactive to light.  Cardiovascular:     Rate and Rhythm: Normal rate and regular rhythm.     Heart sounds: Normal heart sounds.  Pulmonary:     Effort: Pulmonary effort is normal. No tachypnea, accessory muscle usage or respiratory distress.      Breath sounds: Normal breath sounds. No wheezing, rhonchi or rales.     Comments: Moving air well in all lung fields. Chest:     Chest wall: No tenderness.  Lymphadenopathy:     Cervical: No cervical adenopathy.  Skin:    Coloration: Skin is not pale.     Findings: No abrasion, erythema, petechiae or rash. Rash is not papular, urticarial or vesicular.  Neurological:     Mental Status: She is alert.  Psychiatric:        Behavior: Behavior is cooperative.      Diagnostic studies:    Spirometry: results normal (FEV1: 2.01/90%, FVC: 2.36/83%, FEV1/FVC: 856%).    Spirometry consistent with normal pattern.  Allergy Studies: none      Salvatore Marvel, MD  [facility]

## 2020-07-01 ENCOUNTER — Other Ambulatory Visit: Payer: Self-pay | Admitting: Allergy & Immunology

## 2020-07-06 ENCOUNTER — Other Ambulatory Visit: Payer: Self-pay | Admitting: Allergy & Immunology

## 2020-07-06 DIAGNOSIS — R058 Other specified cough: Secondary | ICD-10-CM

## 2020-07-06 NOTE — Telephone Encounter (Signed)
Patient called and needs a refill on singulair called to [address] [phone].

## 2020-07-06 NOTE — Telephone Encounter (Signed)
Dr. Ernst Bowler I did not notice any mention of Singulair in your previous office notes, is it ok to refill this medication?

## 2020-07-07 MED ORDER — MONTELUKAST SODIUM 10 MG PO TABS: 10.0000 mg | ORAL_TABLET | Freq: Every day | ORAL | 1 refills | Status: DC

## 2020-07-07 NOTE — Telephone Encounter (Signed)
It is ok to refill it.  Salvatore Marvel, MD [facility]

## 2020-07-07 NOTE — Telephone Encounter (Signed)
Patient notified of refill on Singulair and will keep her appointment in December.

## 2020-08-03 ENCOUNTER — Encounter: Payer: Self-pay | Admitting: Allergy & Immunology

## 2020-08-03 ENCOUNTER — Other Ambulatory Visit: Payer: Self-pay

## 2020-08-03 ENCOUNTER — Ambulatory Visit (INDEPENDENT_AMBULATORY_CARE_PROVIDER_SITE_OTHER): Payer: 59 | Admitting: Allergy & Immunology

## 2020-08-03 VITALS — BP 160/88 | HR 83 | Resp 18 | Ht 67.0 in | Wt 190.6 lb

## 2020-08-03 DIAGNOSIS — J310 Chronic rhinitis: Secondary | ICD-10-CM

## 2020-08-03 DIAGNOSIS — J4540 Moderate persistent asthma, uncomplicated: Secondary | ICD-10-CM | POA: Diagnosis not present

## 2020-08-03 DIAGNOSIS — T783XXD Angioneurotic edema, subsequent encounter: Secondary | ICD-10-CM

## 2020-08-03 NOTE — Patient Instructions (Addendum)
1. Cough - Lung testing looked great.  - I think that you need a controller medication at this point in time.  - This is because you felt better on the AirDuo when you were using it for one month.  - Samples of Alesco provided.  - Daily controller medication(s): Alvesco 160mg  two puffs twice daily + Singulair 10mg   - Rescue medications: ProAir Digihaler 4 puffs every 4-6 hours as needed - Asthma control goals:  * Full participation in all desired activities (may need albuterol before activity) * Albuterol use two time or less a week on average (not counting use with activity) * Cough interfering with sleep two time or less a month * Oral steroids no more than once a year * No hospitalizations  2. Chronic non- rhinitis - Continue with the Singulair 10mg  daily. - Continue with the as needed use of the antihistamine.   3. Facial angioedema - Double up on the antihistamines when this happens again.   4. Return in about 6 weeks (around 09/14/2020).   Please inform us of any Emergency Department visits, hospitalizations, or changes in symptoms. Call us before going to the ED for breathing or allergy symptoms since we might be able to fit you in for a sick visit. Feel free to contact us anytime with any questions, problems, or concerns.  It was a pleasure to see you again today!  Websites that have reliable patient information: 1. American Academy of Asthma, Allergy, and Immunology: www.aaaai.org 2. Food Allergy Research and Education (FARE): foodallergy.org 3. Mothers of Asthmatics: http://www.asthmacommunitynetwork.org 4. American College of Allergy, Asthma, and Immunology: www.acaai.org   COVID-19 Vaccine Information can be found at: ShippingScam.[email or web address].uk For questions related to vaccine distribution or appointments, please email [email or web address] or call [phone].     "Like" [place] on Facebook and Instagram for our latest  updates!     HAPPY FALL!     Make sure you are registered to vote! If you have moved or changed any of your contact information, you will need to get this updated before voting!  In some cases, you MAY be able to register to vote online: [email or web address]

## 2020-08-03 NOTE — Progress Notes (Signed)
FOLLOW UP  Date of Service/Encounter:  08/03/20   Assessment:   Cough - likely asthma  Non-allergic rhinitis   Plan/Recommendations:   1. Cough - Lung testing looked great.  - I think that you need a controller medication at this point in time.  - This is because you felt better on the AirDuo when you were using it for one month.  - Samples of Alesco provided.  - Daily controller medication(s): Alvesco 160mg  two puffs twice daily + Singulair 10mg   - Rescue medications: ProAir Digihaler 4 puffs every 4-6 hours as needed - Asthma control goals:  * Full participation in all desired activities (may need albuterol before activity) * Albuterol use two time or less a week on average (not counting use with activity) * Cough interfering with sleep two time or less a month * Oral steroids no more than once a year * No hospitalizations  2. Chronic non- rhinitis - Continue with the Singulair 10mg  daily. - Continue with the as needed use of the antihistamine.   3. Facial angioedema - Double up on the antihistamines when this happens again.   4. Return in about 6 weeks (around 09/14/2020).   Subjective:   Rhonda Kirk is a 64 y.o. female presenting today for follow up of  Chief Complaint  Patient presents with  . Asthma    Rhonda Kirk has a history of the following: Patient Active Problem List   Diagnosis Date Noted  . Cough 03/02/2020  . Non-allergic rhinitis 03/02/2020    History obtained from: chart review and patient.  Rhonda Kirk is a 64 y.o. female presenting for a follow up visit.  She was last seen in September 2021.  At that time, still were unclear about her possible asthma diagnosis.  She she did respond well to albuterol, so we decided to start a controller medicine.  We gave her a prescription for AirDuo and a sample.  In the interim, she has done well. Around three weeks ago, she started having puffiness and itching of her face. She was out of the  Singulair and developed this intense facial puffiness. She was not sure what triggered the episode.  She has never had this before.  Asthma/Respiratory Symptom History: Her cough is better. She does still cough. She denies any new triggers. Coughing is only bad when it is triggered from "loud fishy, pungent smells". She has noticed that when she does have a trigger, she loses her voice and the chest hurts. They have moved her desk at work which helps a lot. She is still surrounded by people, but her current location is further away from the "loud supervisor". She never picked up the controller medication since it was $65. She is down to 20 puffs of albuterol. She did try the AirDuo one puff twice daily. It did help soothe it the cough when she was taking the AirDuo for one month.   Allergic Rhinitis Symptom History: She remains on an antihistamine as needed.  She does not use rinses.  Otherwise, there have been no changes to her past medical history, surgical history, family history, or social history.    Review of Systems  Constitutional: Negative.  Negative for chills, fever, malaise/fatigue and weight loss.  HENT: Negative.  Negative for congestion, ear discharge and ear pain.   Eyes: Negative for pain, discharge and redness.  Respiratory: Positive for cough. Negative for sputum production, shortness of breath and wheezing.   Cardiovascular: Negative.  Negative for chest pain and  palpitations.  Gastrointestinal: Negative for abdominal pain, constipation, diarrhea, heartburn, nausea and vomiting.  Skin: Positive for itching and rash.  Neurological: Negative for dizziness and headaches.  Endo/Heme/Allergies: Negative for environmental allergies. Does not bruise/bleed easily.       Objective:   Blood pressure (!) 160/88, pulse 83, resp. rate 18, height 5\' 7"  (1.702 m), weight 190 lb 9.6 oz (86.5 kg), SpO2 97 %. Body mass index is 29.85 kg/m.   Physical Exam:  Physical  Exam Constitutional:      Appearance: She is well-developed.     Comments: Pleasant female.  Cooperative with the exam.  HENT:     Head: Normocephalic and atraumatic.     Right Ear: Tympanic membrane, ear canal and external ear normal.     Left Ear: Tympanic membrane, ear canal and external ear normal.     Nose: No nasal deformity, septal deviation, mucosal edema, rhinorrhea or epistaxis.     Right Turbinates: Enlarged and swollen.     Left Turbinates: Enlarged and swollen.     Right Sinus: No maxillary sinus tenderness or frontal sinus tenderness.     Left Sinus: No maxillary sinus tenderness or frontal sinus tenderness.     Mouth/Throat:     Mouth: Oropharynx is clear and moist. Mucous membranes are not pale and not dry.     Pharynx: Uvula midline.  Eyes:     General:        Right eye: No discharge.        Left eye: No discharge.     Extraocular Movements: EOM normal.     Conjunctiva/sclera: Conjunctivae normal.     Right eye: Right conjunctiva is not injected. No chemosis.    Left eye: Left conjunctiva is not injected. No chemosis.    Pupils: Pupils are equal, round, and reactive to light.  Cardiovascular:     Rate and Rhythm: Normal rate and regular rhythm.     Heart sounds: Normal heart sounds.  Pulmonary:     Effort: Pulmonary effort is normal. No tachypnea, accessory muscle usage or respiratory distress.     Breath sounds: Normal breath sounds. No wheezing, rhonchi or rales.     Comments: Moving air well in all lung fields.  No increased work of breathing. Chest:     Chest wall: No tenderness.  Lymphadenopathy:     Cervical: No cervical adenopathy.  Skin:    Coloration: Skin is not pale.     Findings: No abrasion, erythema, petechiae or rash. Rash is not papular, urticarial or vesicular.     Comments: No eczematous or urticarial lesions noted.  Neurological:     Mental Status: She is alert.  Psychiatric:        Mood and Affect: Mood and affect normal.       Diagnostic studies:    Spirometry: results normal (FEV1: 1.89/83%, FVC: 2.21/76%, FEV1/FVC: 86%).    Spirometry consistent with normal pattern.   Allergy Studies: none       Salvatore Marvel, MD  [facility]

## 2020-08-04 ENCOUNTER — Encounter: Payer: Self-pay | Admitting: Allergy & Immunology

## 2020-09-02 ENCOUNTER — Other Ambulatory Visit: Payer: Self-pay | Admitting: Allergy & Immunology

## 2020-09-02 DIAGNOSIS — R058 Other specified cough: Secondary | ICD-10-CM

## 2020-09-14 ENCOUNTER — Ambulatory Visit: Payer: 59 | Admitting: Allergy & Immunology

## 2020-09-26 ENCOUNTER — Other Ambulatory Visit: Payer: Self-pay

## 2020-09-26 ENCOUNTER — Ambulatory Visit: Payer: 59 | Admitting: Allergy & Immunology

## 2020-09-26 VITALS — BP 140/82 | HR 75 | Temp 96.7°F | Resp 18 | Ht 67.0 in | Wt 195.6 lb

## 2020-09-26 DIAGNOSIS — J310 Chronic rhinitis: Secondary | ICD-10-CM

## 2020-09-26 DIAGNOSIS — J4540 Moderate persistent asthma, uncomplicated: Secondary | ICD-10-CM

## 2020-09-26 MED ORDER — ALVESCO 160 MCG/ACT IN AERS: 1.0000 | INHALATION_SPRAY | Freq: Two times a day (BID) | RESPIRATORY_TRACT | 0 refills | Status: DC

## 2020-09-26 NOTE — Progress Notes (Signed)
FOLLOW UP  Date of Service/Encounter:  09/27/20   Assessment:   Cough- likely asthma  Non-allergic rhinitis   Ms. Rhonda Kirk to be doing very well with her current regimen.  She continues to have some coworkers that purposefully wear perfume that trigger her symptoms, but she has been able to isolate herself from them to allergic stents.  [name] to continue with the current regimen including the inhaled steroid every day.  I think we are in a very good place.  Plan/Recommendations:   1. Cough - Lung testing looks amazing today. - We are going to send in [name] today (there is a $0 copay card).  - Daily controller medication(s): Alvesco 160mg  two puffs twice daily + Singulair 10mg   - Rescue medications: ProAir Digihaler 4 puffs every 4-6 hours as needed  - Asthma control goals:  * Full participation in all desired activities (may need albuterol before activity) * Albuterol use two time or less a week on average (not counting use with activity) * Cough interfering with sleep two time or less a month * Oral steroids no more than once a year * No hospitalizations  2. Chronic non- rhinitis - Continue with the Singulair 10mg  daily. - Continue with the as needed use of the Claritin.   3. Facial angioedema - Double up on the Claritin when this happens again.  4. Return in about 6 months (around 03/26/2021).   Subjective:   Rhonda Kirk is a 65 y.o. female presenting today for follow up of  Chief Complaint  Patient presents with  . Asthma    Perfume causes headaches - new inhaler has opened up sinuses causes scent sensitivity   . Cough    Coughs attacks to fant or strong sents     Rhonda Kirk has a history of the following: Patient Active Problem List   Diagnosis Date Noted  . Cough 03/02/2020  . Non-allergic rhinitis 03/02/2020    History obtained from: chart review and patient.  Rhonda Kirk is a 65 y.o. female presenting for a follow up visit. She was last  seen in December 2021. At that time, her lung testing looked excellent. We did feel that she needed a controller medication. Her AirDuo had done well, but it was not covered by her pharmacy. Therefore we started her on Alvesco 111mcg two puffs twice daily with Singulair as well as albuterol as needed. For her rhinitis, we continued with Singulair as well as antihistamines as needed.   Since the last visit, she has done well.    Asthma/Respiratory Symptom History: She has been needing the Alvesco two puffs twice daily. She does feel that this has helped. She is having exposure to her supervisor who has the strong perfume. She does have albuterol nebulizer solution but she does not have the machine. She is not entirely convinced that she needs the machine.   Allergic Rhinitis Symptom History: She has been doing well with the rhinitis. She is using her Singulair as well as her antihistamine. She has not needed any antibiotics and has had no sinus issues at all.  Otherwise, there have been no changes to her past medical history, surgical history, family history, or social history.    Review of Systems  Constitutional: Negative.  Negative for chills, fever, malaise/fatigue and weight loss.  HENT: Positive for congestion. Negative for ear discharge, ear pain and sinus pain.   Eyes: Negative for pain, discharge and redness.  Respiratory: Positive for cough. Negative for sputum production, shortness  of breath and wheezing.   Cardiovascular: Negative.  Negative for chest pain and palpitations.  Gastrointestinal: Negative for abdominal pain, constipation, diarrhea, heartburn, nausea and vomiting.  Skin: Negative.  Negative for itching and rash.  Neurological: Negative for dizziness and headaches.  Endo/Heme/Allergies: Negative for environmental allergies. Does not bruise/bleed easily.       Objective:   Blood pressure 140/82, pulse 75, temperature (!) 96.7 F (35.9 C), resp. rate 18, height 5\' 7"   (1.702 m), weight 195 lb 9.6 oz (88.7 kg), SpO2 98 %. Body mass index is 30.64 kg/m.   Physical Exam:  Physical Exam Constitutional:      Appearance: She is well-developed.  HENT:     Head: Normocephalic and atraumatic.     Right Ear: Tympanic membrane, ear canal and external ear normal.     Left Ear: Tympanic membrane, ear canal and external ear normal.     Nose: No nasal deformity, septal deviation, mucosal edema, rhinorrhea or epistaxis.     Right Turbinates: Enlarged and swollen.     Left Turbinates: Enlarged and swollen.     Right Sinus: No maxillary sinus tenderness or frontal sinus tenderness.     Left Sinus: No maxillary sinus tenderness or frontal sinus tenderness.     Comments: There is some clear discharge noted. No cobblestoning noted. No nasal polyps noted.     Mouth/Throat:     Mouth: Oropharynx is clear and moist. Mucous membranes are not pale and not dry.     Pharynx: Uvula midline.  Eyes:     General:        Right eye: No discharge.        Left eye: No discharge.     Extraocular Movements: EOM normal.     Conjunctiva/sclera: Conjunctivae normal.     Right eye: Right conjunctiva is not injected. No chemosis.    Left eye: Left conjunctiva is not injected. No chemosis.    Pupils: Pupils are equal, round, and reactive to light.  Cardiovascular:     Rate and Rhythm: Normal rate and regular rhythm.     Heart sounds: Normal heart sounds.  Pulmonary:     Effort: Pulmonary effort is normal. No tachypnea, accessory muscle usage or respiratory distress.     Breath sounds: Normal breath sounds. No wheezing, rhonchi or rales.     Comments: Moving air well in all lung fields. No increased work of breathing noted.  Chest:     Chest wall: No tenderness.  Lymphadenopathy:     Cervical: No cervical adenopathy.  Skin:    General: Skin is warm.     Capillary Refill: Capillary refill takes less than 2 seconds.     Coloration: Skin is not pale.     Findings: No abrasion,  erythema, petechiae or rash. Rash is not papular, urticarial or vesicular.     Comments: No eczematous or urticarial lesions noted.   Neurological:     Mental Status: She is alert.  Psychiatric:        Mood and Affect: Mood and affect normal.      Diagnostic studies:    Spirometry: results normal (FEV1: 1.74/76%, FVC: 2.23/77%, FEV1/FVC: 78%).    Spirometry consistent with normal pattern.   Allergy Studies: none       Salvatore Marvel, MD  [facility]

## 2020-09-26 NOTE — Patient Instructions (Addendum)
1. Cough - Lung testing looks amazing today. - We are going to send in [place] today (there is a $0 copay card).  - Daily controller medication(s): Alvesco 160mg  two puffs twice daily + Singulair 10mg   - Rescue medications: ProAir Digihaler 4 puffs every 4-6 hours as needed  - Asthma control goals:  * Full participation in all desired activities (may need albuterol before activity) * Albuterol use two time or less a week on average (not counting use with activity) * Cough interfering with sleep two time or less a month * Oral steroids no more than once a year * No hospitalizations  2. Chronic non- rhinitis - Continue with the Singulair 10mg  daily. - Continue with the as needed use of the Claritin.   3. Facial angioedema - Double up on the Claritin when this happens again.  4. Return in about 6 months (around 03/26/2021).    Please inform us of any Emergency Department visits, hospitalizations, or changes in symptoms. Call us before going to the ED for breathing or allergy symptoms since we might be able to fit you in for a sick visit. Feel free to contact us anytime with any questions, problems, or concerns.  It was a pleasure to see you again today!  Websites that have reliable patient information: 1. American Academy of Asthma, Allergy, and Immunology: www.aaaai.org 2. Food Allergy Research and Education (FARE): foodallergy.org 3. Mothers of Asthmatics: http://www.asthmacommunitynetwork.org 4. American College of Allergy, Asthma, and Immunology: www.acaai.org   COVID-19 Vaccine Information can be found at: ShippingScam.co.uk For questions related to vaccine distribution or appointments, please email [email or web address] or call [phone].     "Like" [place] on Facebook and Instagram for our latest updates!       Make sure you are registered to vote! If you have moved or changed any of your contact information, you  will need to get this updated before voting!  In some cases, you MAY be able to register to vote online: [email or web address]

## 2020-09-27 ENCOUNTER — Encounter: Payer: Self-pay | Admitting: Allergy & Immunology

## 2020-09-29 ENCOUNTER — Telehealth: Payer: Self-pay | Admitting: Allergy & Immunology

## 2020-09-29 ENCOUNTER — Other Ambulatory Visit: Payer: Self-pay

## 2020-09-29 MED ORDER — ALVESCO 160 MCG/ACT IN AERS: 1.0000 | INHALATION_SPRAY | Freq: Two times a day (BID) | RESPIRATORY_TRACT | 1 refills | Status: DC

## 2020-09-29 NOTE — Telephone Encounter (Signed)
Refill sent in

## 2020-09-29 NOTE — Telephone Encounter (Signed)
Pharmacy called wanting to add refills to Alvesco prescription due to the way the program is set up, patient can get 2 inhalers for $50 instead of just one.  Please advise.

## 2020-11-02 IMAGING — DX DG CHEST 4+V
3 series · 3 of 3 positions shown · non-contrast
Comparison: Chest radiographs dated 08/30/2019

CLINICAL DATA: Chronic cough. Possible nodular density at the left
lung base on recent chest radiographs.

EXAM:
CHEST - 4+ VIEW

[w chest pa (1 of 3)]
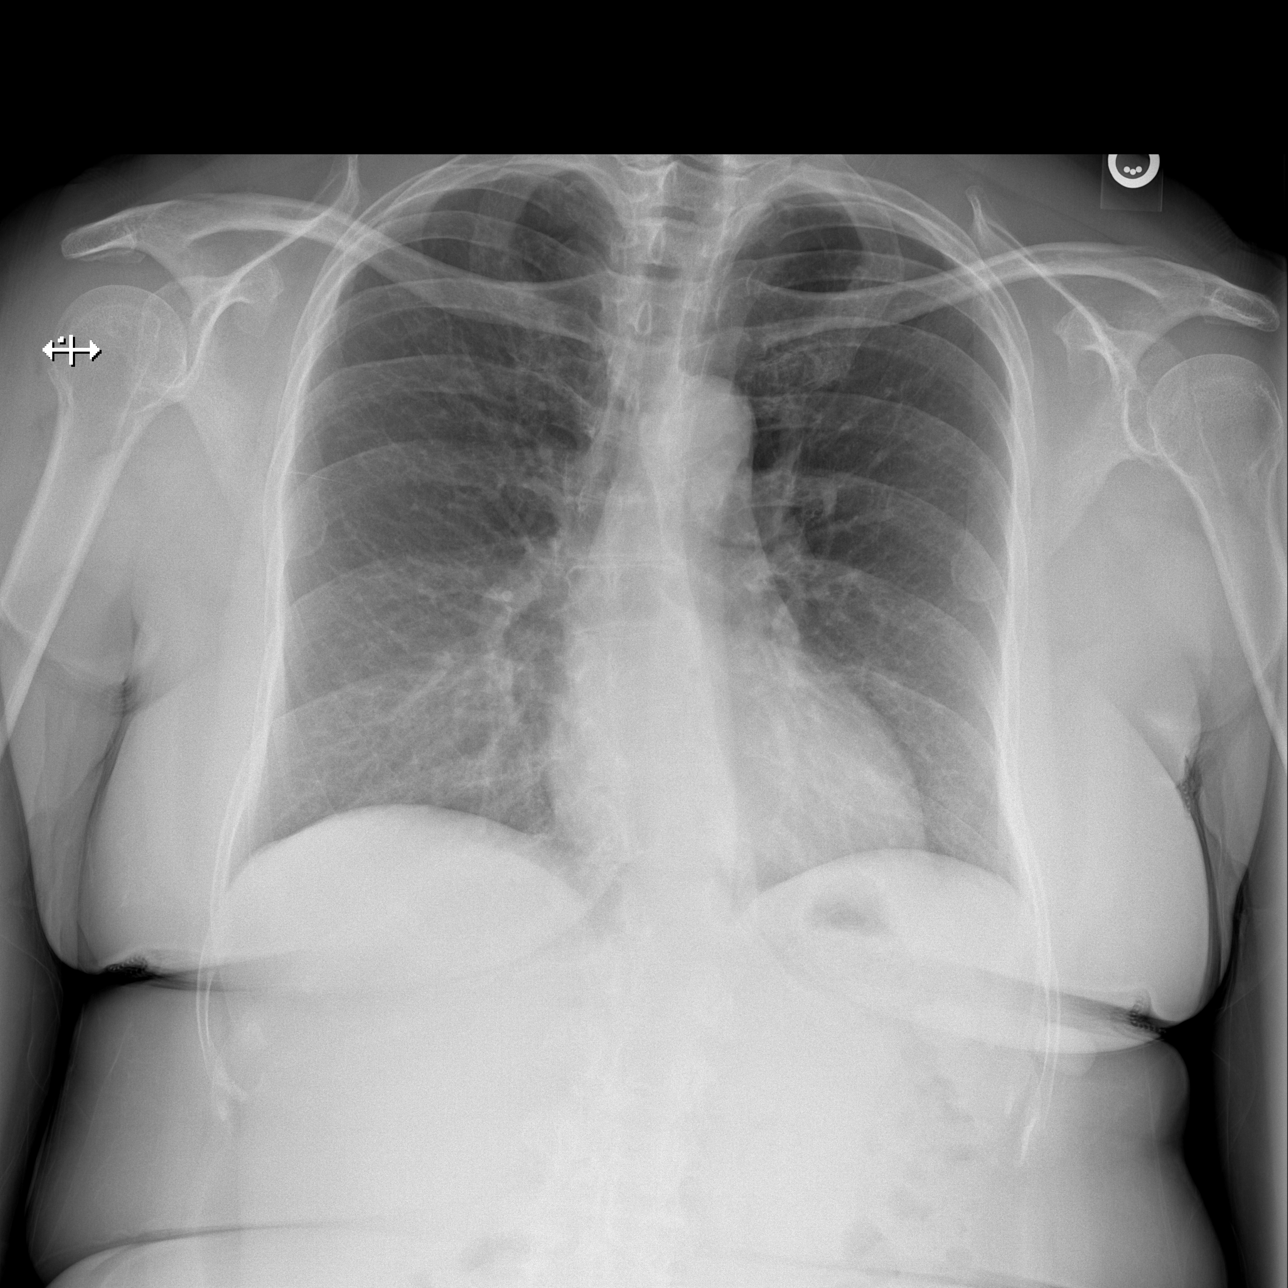

[w chest pa (2 of 3)]
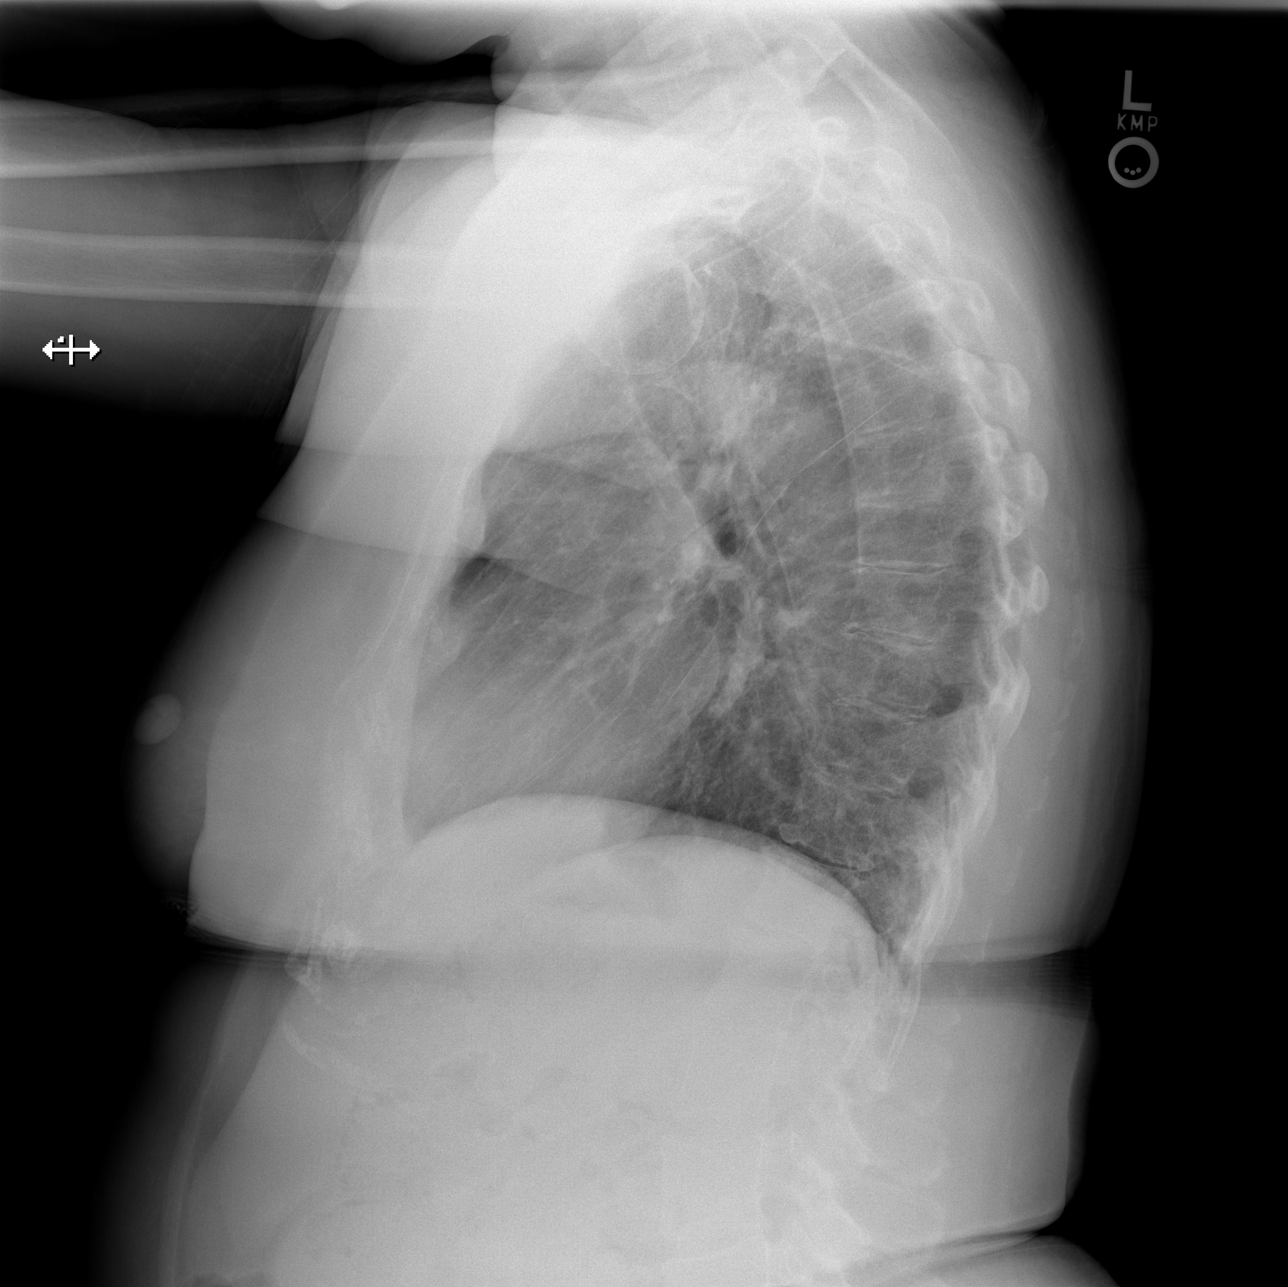

[w chest pa (3 of 3)]
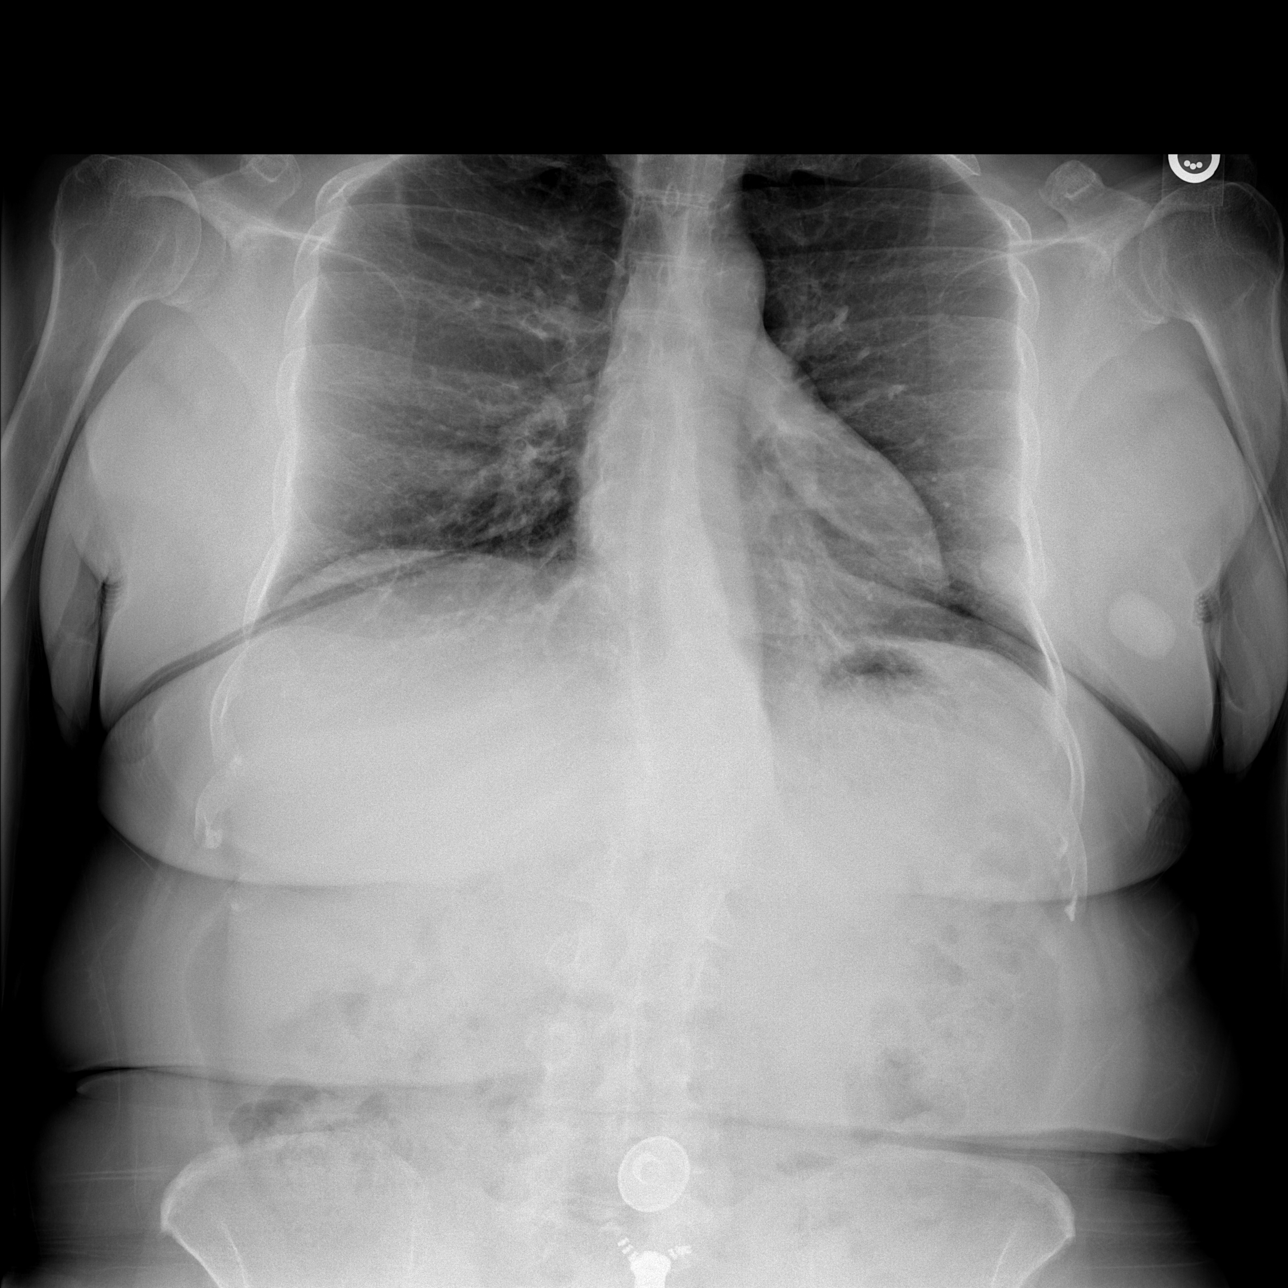

[3 of 3 positions shown; findings below may reference images not displayed]

FINDINGS: Normal sized heart. Clear lungs. The previously seen nodular density
overlying the left lower lung zone is no longer demonstrated. Stable
mild-to-moderate thoracolumbar scoliosis and mild thoracic spine
degenerative changes.
IMPRESSION: 1. No acute abnormality.
2. The previously seen nodular density overlying the left lower lung
zone is no longer demonstrated. This most likely represented
overlapping vessels and ribs on the previous radiographs.

## 2020-12-16 IMAGING — MG DIGITAL SCREENING BILAT W/ TOMO W/ CAD
8 series · 9 of 24 positions shown · non-contrast
Comparison: Previous exam(s).

CLINICAL DATA: Screening.

EXAM:
DIGITAL SCREENING BILATERAL MAMMOGRAM WITH TOMO AND CAD

[L MLO synth-2D]
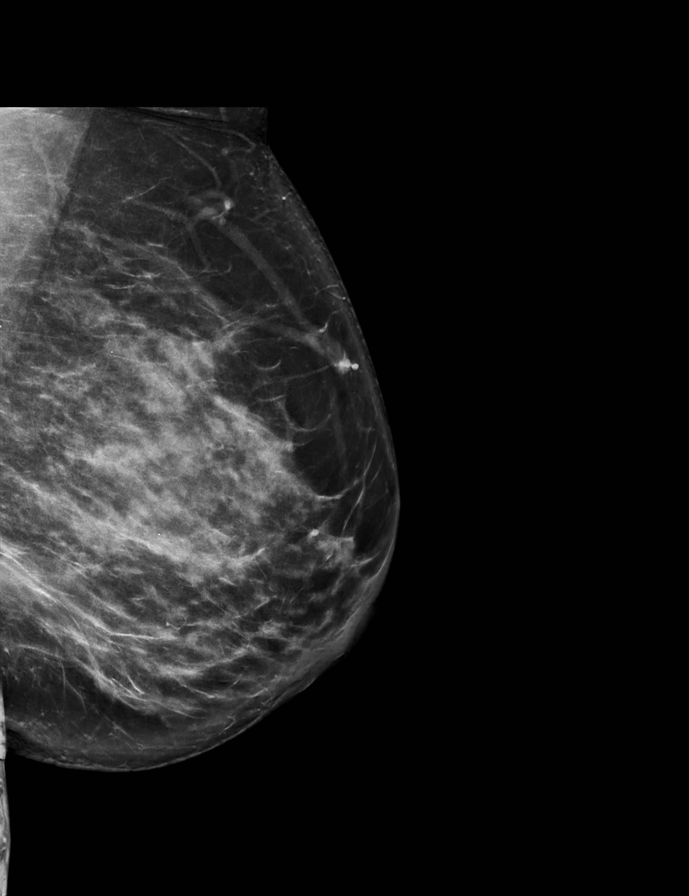

[R CC synth-2D]
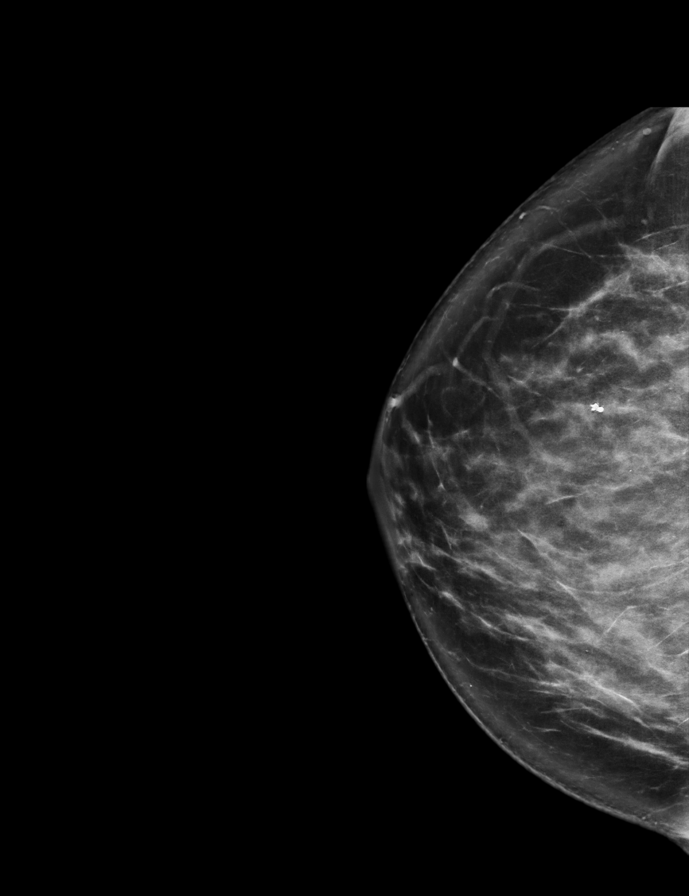

[L CC synth-2D]
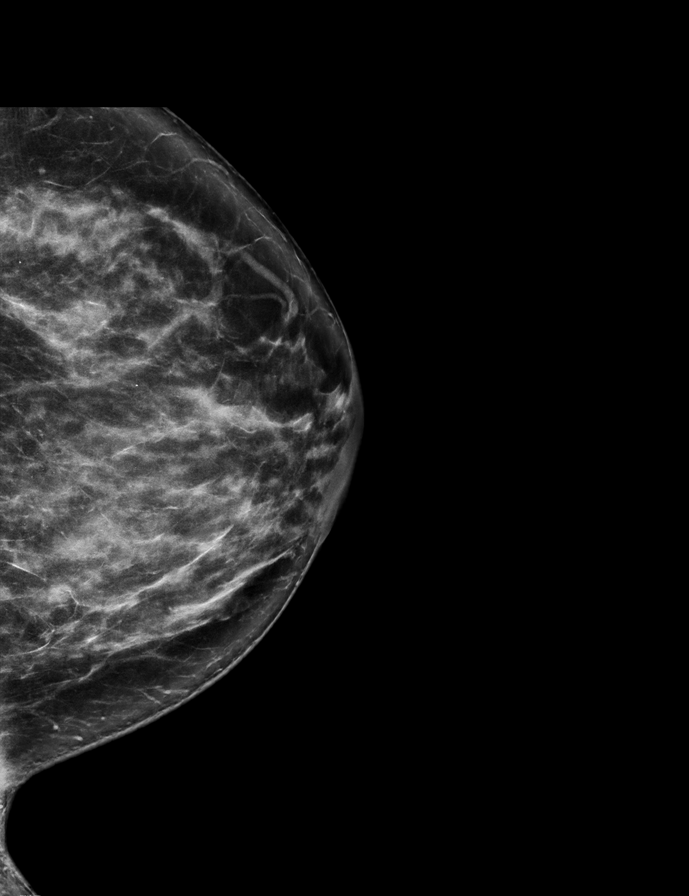

[R MLO synth-2D]
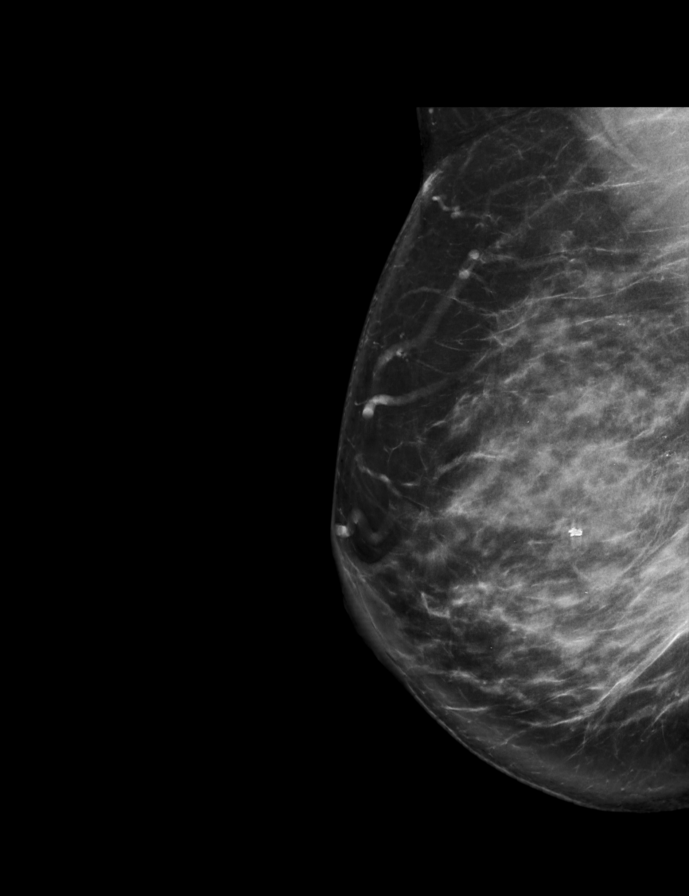

[L MLO tomo · 2 of 86 frames shown]
[frame 28/86]
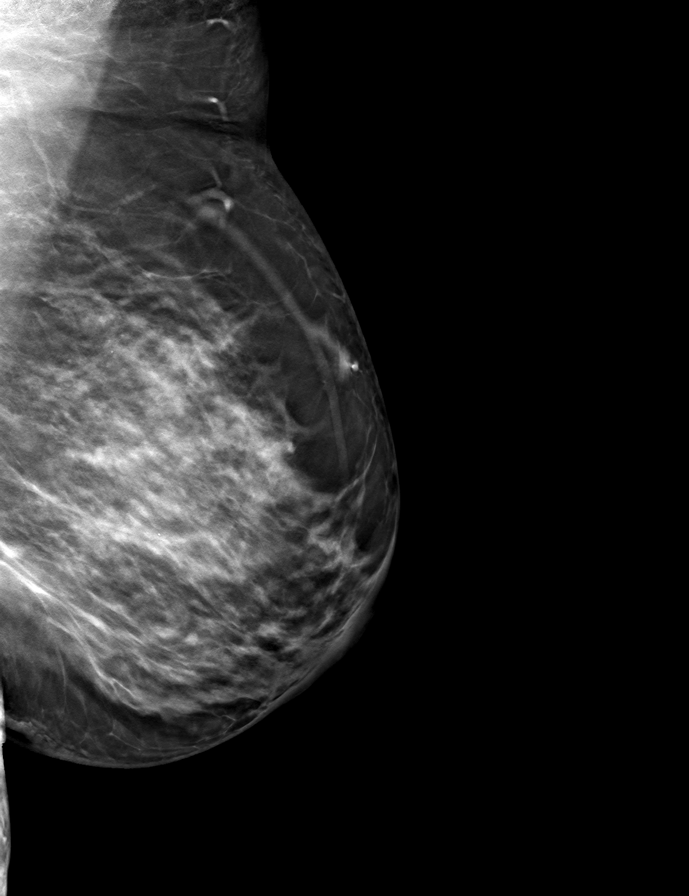
[frame 43/86]
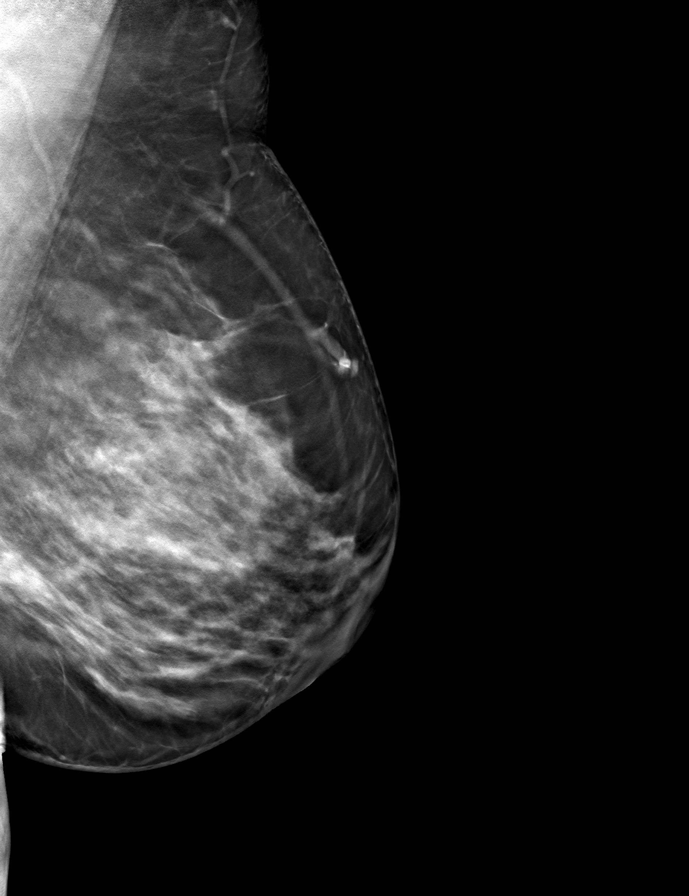

[R CC tomo · tomo slice 41/81.0]
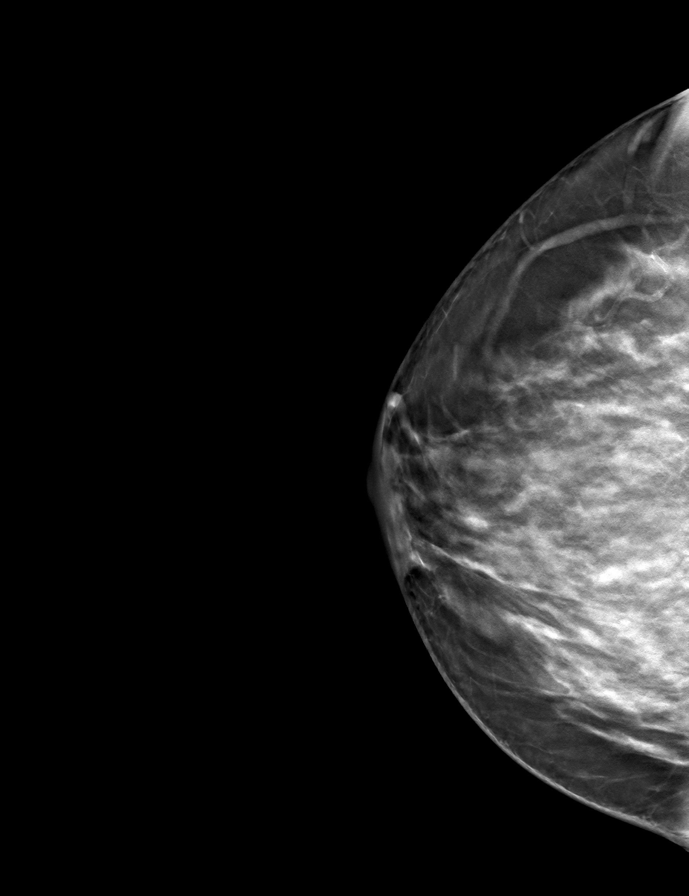

[L CC tomo · tomo slice 38/75.0]
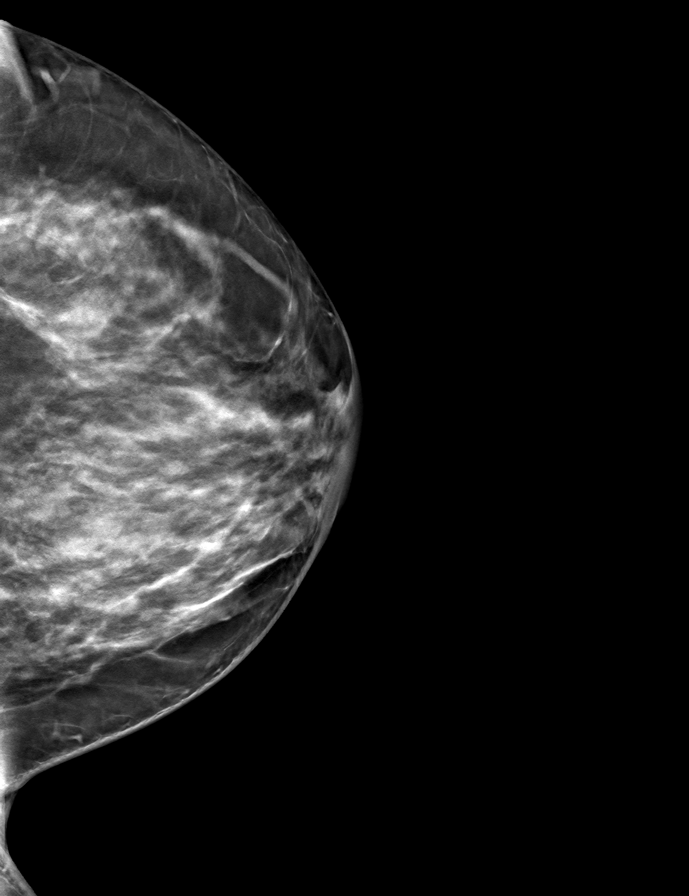

[R MLO tomo · tomo slice 45/89.0]
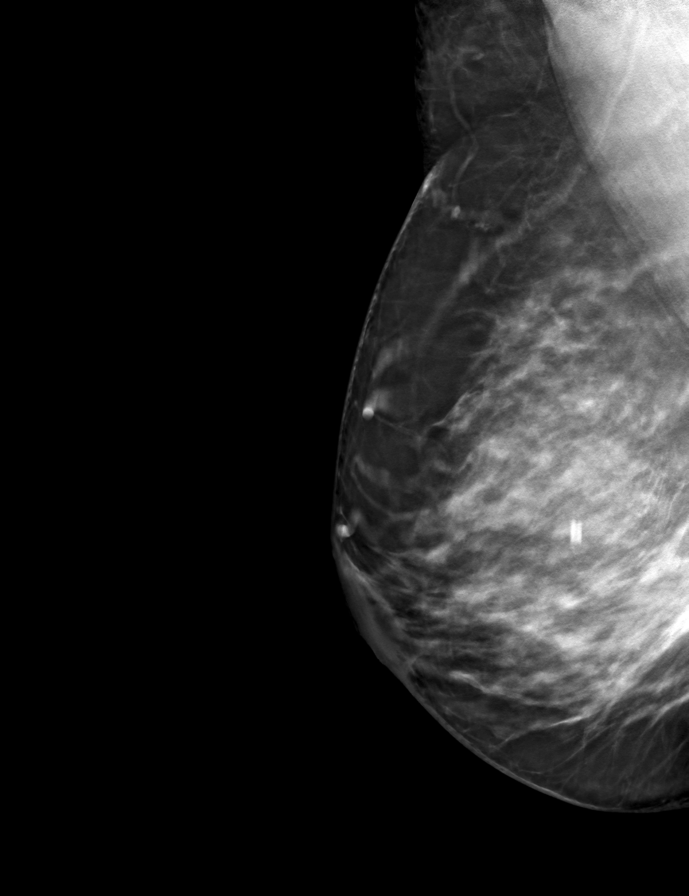

[9 of 24 positions shown; findings below may reference images not displayed]

ACR Breast Density Category d: The breast tissue is extremely dense,
which lowers the sensitivity of mammography
FINDINGS: There are no findings suspicious for malignancy. Images were
processed with CAD.
IMPRESSION: No mammographic evidence of malignancy. A result letter of this
screening mammogram will be mailed directly to the patient.

RECOMMENDATION:
Screening mammogram in one year. (Code:[id number]-[id number])

BI-RADS CATEGORY  1: Negative.

## 2020-12-19 ENCOUNTER — Other Ambulatory Visit: Payer: Self-pay | Admitting: Allergy & Immunology

## 2020-12-19 NOTE — Telephone Encounter (Signed)
Pt called requesting refill for Alvesco inhaler sent to [facility] ([address] [place], [place] [address])  Best contact # [phone].   Please advise.

## 2021-02-08 ENCOUNTER — Other Ambulatory Visit: Payer: Self-pay | Admitting: Allergy & Immunology

## 2021-02-08 DIAGNOSIS — R058 Other specified cough: Secondary | ICD-10-CM

## 2021-03-22 ENCOUNTER — Ambulatory Visit: Payer: 59 | Admitting: Allergy & Immunology

## 2021-03-27 ENCOUNTER — Ambulatory Visit: Payer: 59 | Admitting: Allergy & Immunology

## 2021-03-27 ENCOUNTER — Other Ambulatory Visit: Payer: Self-pay

## 2021-03-27 ENCOUNTER — Encounter: Payer: Self-pay | Admitting: Allergy & Immunology

## 2021-03-27 VITALS — BP 140/84 | HR 82 | Temp 97.3°F | Ht 67.0 in | Wt 192.0 lb

## 2021-03-27 DIAGNOSIS — J310 Chronic rhinitis: Secondary | ICD-10-CM

## 2021-03-27 DIAGNOSIS — J4540 Moderate persistent asthma, uncomplicated: Secondary | ICD-10-CM

## 2021-03-27 MED ORDER — MONTELUKAST SODIUM 10 MG PO TABS: 10.0000 mg | ORAL_TABLET | Freq: Every day | ORAL | 5 refills | Status: DC

## 2021-03-27 MED ORDER — ALBUTEROL SULFATE (2.5 MG/3ML) 0.083% IN NEBU: 2.5000 mg | INHALATION_SOLUTION | Freq: Four times a day (QID) | RESPIRATORY_TRACT | 1 refills | Status: DC | PRN

## 2021-03-27 MED ORDER — ALVESCO 160 MCG/ACT IN AERS: 2.0000 | INHALATION_SPRAY | Freq: Two times a day (BID) | RESPIRATORY_TRACT | 5 refills | Status: DC

## 2021-03-27 MED ORDER — LORATADINE 10 MG PO TABS: ORAL_TABLET | ORAL | 5 refills | Status: DC

## 2021-03-27 MED ORDER — PROAIR DIGIHALER 108 (90 BASE) MCG/ACT IN AEPB: INHALATION_SPRAY | RESPIRATORY_TRACT | 1 refills | Status: DC

## 2021-03-27 NOTE — Progress Notes (Signed)
FOLLOW UP  Date of Service/Encounter:  03/27/21   Assessment:   Moderate persistent asthma, uncomplicated  Non-allergic rhinitis   Shellfish allergy  Plan/Recommendations:   1. Cough - Lung testing looks amazing today. - We are going to send in [place] today (there is a $0 copay card).  - Daily controller medication(s): Alvesco '160mg'$  two puffs twice daily + Singulair '10mg'$   - Rescue medications: ProAir Digihaler 4 puffs every 4-6 hours as needed  - Asthma control goals:  * Full participation in all desired activities (may need albuterol before activity) * Albuterol use two time or less a week on average (not counting use with activity) * Cough interfering with sleep two time or less a month * Oral steroids no more than once a year * No hospitalizations  2. Chronic non-rhinitis - Continue with the Singulair '10mg'$  daily. - Continue with the as needed use of the Claritin.   3. Facial angioedema - Double up on the Claritin when this happens again.  4. Return in about 6 months (around 09/27/2021).    Subjective:   Rhonda Kirk is a 65 y.o. female presenting today for follow up of  Chief Complaint  Patient presents with   Asthma   Cough    Constant     Rhonda Kirk has a history of the following: Patient Active Problem List   Diagnosis Date Noted   Cough 03/02/2020   Non-allergic rhinitis 03/02/2020    History obtained from: chart review and patient.  Rhonda Kirk is a 65 y.o. female presenting for a follow up visit.  She was last seen in February 2022.  At that time, her lung testing looked great.  We sent in Alvesco 160 mcg 2 puffs twice daily in combination with Singulair 10 mg daily.  She also has a Air cabin crew that she uses as needed.  For her rhinitis, we continue with Singulair and Claritin as needed.  Her facial angioedema was controlled with Claritin twice daily during flares.  Since the last visit, she has done well.  Asthma/Respiratory Symptom  History: She continues to have some issues  . She is on Alvesco. She is going into the office during the weekday. She is having issues when she is at work. Shawna at [employer] is wondering about how close someone else can be to her when she is at work. This particular supervisor can be smelled before she sees her. She was allowed to put up a sign to avoid perfumes and colognes. She can put this INSIDE of the cubicle, but not OUTSIDE of the cubicle. She has a meeting with the Director of [employer] in the morning.   She feels that the Alvesco does work for her. She normally does not cough until someone with strong perfume is too strong. She has not gotten an air purifier for her cubicle yet but this is on her to do list. She has coughing with cold drinks. She thinks that the Alvesco does better than the AirDuo.   Allergic Rhinitis Symptom History: She remains on the Claritin and the Singulair. She has not needed any antibiotics.   She has had no facial edema.  This seems to have been an isolated incident.  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  Otherwise, there have been no changes to her past medical history, surgical history, family history, or social history.    Review of Systems  Constitutional: Negative.  Negative for chills, fever, malaise/fatigue and  weight loss.  HENT: Negative.  Negative for congestion, ear discharge, ear pain, sinus pain and sore throat.   Eyes:  Negative for pain, discharge and redness.  Respiratory:  Negative for cough, sputum production, shortness of breath and wheezing.   Cardiovascular: Negative.  Negative for chest pain and palpitations.  Gastrointestinal:  Negative for abdominal pain, constipation, diarrhea, heartburn, nausea and vomiting.  Skin: Negative.  Negative for itching and rash.  Neurological:  Negative for dizziness and headaches.  Endo/Heme/Allergies:  Negative for environmental allergies. Does not bruise/bleed easily.       Objective:   Blood pressure 140/84, pulse 82, temperature (!) 97.3 F (36.3 C), temperature source Temporal, height '5\' 7"'$  (1.702 m), weight 192 lb (87.1 kg), SpO2 97 %. Body mass index is 30.07 kg/m.   Physical Exam:  Physical Exam Vitals reviewed.  Constitutional:      Appearance: She is well-developed.     Comments: Very pleasant and talkative.  HENT:     Head: Normocephalic and atraumatic.     Right Ear: Tympanic membrane, ear canal and external ear normal.     Left Ear: Tympanic membrane, ear canal and external ear normal.     Nose: No nasal deformity, septal deviation, mucosal edema or rhinorrhea.     Right Turbinates: Not enlarged or swollen.     Left Turbinates: Not enlarged or swollen.     Right Sinus: No maxillary sinus tenderness or frontal sinus tenderness.     Left Sinus: No maxillary sinus tenderness or frontal sinus tenderness.     Mouth/Throat:     Mouth: Mucous membranes are not pale and not dry.     Pharynx: Uvula midline.  Eyes:     General: Lids are normal. No allergic shiner.       Right eye: No discharge.        Left eye: No discharge.     Conjunctiva/sclera: Conjunctivae normal.     Right eye: Right conjunctiva is not injected. No chemosis.    Left eye: Left conjunctiva is not injected. No chemosis.    Pupils: Pupils are equal, round, and reactive to light.  Cardiovascular:     Rate and Rhythm: Normal rate and regular rhythm.     Heart sounds: Normal heart sounds.  Pulmonary:     Effort: Pulmonary effort is normal. No tachypnea, accessory muscle usage or respiratory distress.     Breath sounds: Normal breath sounds. No wheezing, rhonchi or rales.     Comments: Breathing comfortably. Chest:     Chest wall: No tenderness.  Lymphadenopathy:     Cervical: No cervical adenopathy.  Skin:    General: Skin is warm.     Capillary Refill: Capillary refill takes less than 2 seconds.     Coloration: Skin is not pale.     Findings: No abrasion,  erythema, petechiae or rash. Rash is not papular, urticarial or vesicular.  Neurological:     Mental Status: She is alert.  Psychiatric:        Behavior: Behavior is cooperative.     Diagnostic studies:    Spirometry: results normal (FEV1: 1.85/81%, FVC: 2.21/76%, FEV1/FVC: 84%).    Spirometry consistent with normal pattern.   Allergy Studies: none        Salvatore Marvel, MD  [facility]

## 2021-03-27 NOTE — Patient Instructions (Addendum)
1. Cough - Lung testing looks amazing today. - We are going to send in [place] today (there is a $0 copay card).  - Daily controller medication(s): Alvesco '160mg'$  two puffs twice daily + Singulair '10mg'$   - Rescue medications: ProAir Digihaler 4 puffs every 4-6 hours as needed  - Asthma control goals:  * Full participation in all desired activities (may need albuterol before activity) * Albuterol use two time or less a week on average (not counting use with activity) * Cough interfering with sleep two time or less a month * Oral steroids no more than once a year * No hospitalizations  2. Chronic non- rhinitis - Continue with the Singulair '10mg'$  daily. - Continue with the as needed use of the Claritin.   3. Facial angioedema - Double up on the Claritin when this happens again.  4. Return in about 6 months (around 09/27/2021).    Please inform us of any Emergency Department visits, hospitalizations, or changes in symptoms. Call us before going to the ED for breathing or allergy symptoms since we might be able to fit you in for a sick visit. Feel free to contact us anytime with any questions, problems, or concerns.  It was a pleasure to see you again today!  Websites that have reliable patient information: 1. American Academy of Asthma, Allergy, and Immunology: www.aaaai.org 2. Food Allergy Research and Education (FARE): foodallergy.org 3. Mothers of Asthmatics: http://www.asthmacommunitynetwork.org 4. American College of Allergy, Asthma, and Immunology: www.acaai.org   COVID-19 Vaccine Information can be found at: ShippingScam.co.uk For questions related to vaccine distribution or appointments, please email [email or web address] or call [phone].     "Like" [place] on Facebook and Instagram for our latest updates!       Make sure you are registered to vote! If you have moved or changed any of your contact information, you  will need to get this updated before voting!  In some cases, you MAY be able to register to vote online: [email or web address]

## 2021-04-12 ENCOUNTER — Ambulatory Visit
Admission: EM | Admit: 2021-04-12 | Discharge: 2021-04-12 | Disposition: A | Discharge status: Home / Self Care | Payer: 59 | Attending: Family Medicine | Admitting: Family Medicine

## 2021-04-12 ENCOUNTER — Other Ambulatory Visit: Payer: Self-pay

## 2021-04-12 ENCOUNTER — Encounter: Payer: Self-pay | Admitting: Emergency Medicine

## 2021-04-12 DIAGNOSIS — T148XXA Other injury of unspecified body region, initial encounter: Secondary | ICD-10-CM | POA: Diagnosis not present

## 2021-04-12 DIAGNOSIS — M7989 Other specified soft tissue disorders: Secondary | ICD-10-CM

## 2021-04-12 MED ORDER — CEPHALEXIN 500 MG PO CAPS: 500.0000 mg | ORAL_CAPSULE | Freq: Two times a day (BID) | ORAL | 0 refills | Status: DC

## 2021-04-12 MED ORDER — HIBICLENS 4 % EX LIQD: Freq: Every day | CUTANEOUS | 0 refills | Status: DC | PRN

## 2021-04-12 NOTE — ED Triage Notes (Signed)
Pt with redness or possible bruising to let ankle area x 3 weeks with some peeling; ankle is noted to be swollen which pt is sts per norm for her

## 2021-04-12 NOTE — ED Provider Notes (Signed)
[facility]    CSN: [id number] Arrival date & time: 04/12/21  1817      History   Chief Complaint Chief Complaint  Patient presents with   Bleeding/Bruising    HPI Rhonda Kirk is a 65 y.o. female.   Patient presenting today with several week history of a discolored area on the inner portion of her left lower leg that the past few days has begun to blister and weep.  She states the area is very sore to the touch.  Has chronic edema in this leg since fracturing the ankle years ago.  She denies numbness, tingling, pain up into the leg, fever, chills, chest pain, shortness of breath, palpitations.  Has not been trying anything for symptoms other than washing the area in the shower.  Past Medical History:  Diagnosis Date   Asthma    Ovarian cancer [facility])     Patient Active Problem List   Diagnosis Date Noted   Cough 03/02/2020   Non-allergic rhinitis 03/02/2020    Past Surgical History:  Procedure Laterality Date   ABDOMINAL HYSTERECTOMY     BREAST LUMPECTOMY      OB History   No obstetric history on file.      Home Medications    Prior to Admission medications   Medication Sig Start Date End Date Taking? Authorizing Provider  cephALEXin (KEFLEX) 500 MG capsule Take 1 capsule (500 mg total) by mouth 2 (two) times daily. 04/12/21  Yes Volney American, PA-C  chlorhexidine (HIBICLENS) 4 % external liquid Apply topically daily as needed. 04/12/21  Yes Volney American, PA-C  albuterol (PROVENTIL) (2.5 MG/3ML) 0.083% nebulizer solution Take 3 mLs (2.5 mg total) by nebulization every 6 (six) hours as needed for wheezing or shortness of breath. 03/27/21   Valentina Shaggy, MD  Albuterol Sulfate, sensor, [facility]) [phone] Base) MCG/ACT AEPB 4 puffs every 4-6 hours as needed 03/27/21   Valentina Shaggy, MD  ALVESCO 160 MCG/ACT inhaler INHALE 1 PUFF BY MOUTH INTO THE LUNGS TWICE DAILY 12/19/20   Valentina Shaggy, MD   budesonide-formoterol [facility]) 80-4.5 MCG/ACT inhaler Inhale 2 puffs into the lungs in the morning and at bedtime. Patient not taking: Reported on 03/27/2021 07/03/20   Valentina Shaggy, MD  ciclesonide (ALVESCO) 160 MCG/ACT inhaler Inhale 2 puffs into the lungs 2 (two) times daily. 03/27/21   Valentina Shaggy, MD  loratadine (CLARITIN) 10 MG tablet Take 1 tablet by mouth 1-2 times a day 03/27/21   Valentina Shaggy, MD  montelukast (SINGULAIR) 10 MG tablet Take 1 tablet (10 mg total) by mouth at bedtime. 03/27/21   Valentina Shaggy, MD  Omega-3 Fatty Acids (FISH OIL) 1000 MG CAPS Take 2 capsules by mouth daily.    [provider]    Family History Family History  Problem Relation Age of Onset   Hypertension Mother    Diabetes Mother    Diabetes Father    Diabetes Sister    Hypertension Sister    Kidney disease Sister        Double mastectomy    Social History Social History   Tobacco Use   Smoking status: Never   Smokeless tobacco: Never  Substance Use Topics   Alcohol use: Never   Drug use: Never     Allergies   Shellfish allergy and Benadryl [diphenhydramine]   Review of Systems Review of Systems Per HPI  Physical Exam Triage Vital Signs ED Triage Vitals  Enc  Vitals Group     BP 04/12/21 1853 (!) 183/84     Pulse Rate 04/12/21 1853 79     Resp 04/12/21 1853 18     Temp 04/12/21 1853 98.1 F (36.7 C)     Temp Source 04/12/21 1853 Oral     SpO2 04/12/21 1853 97 %     Weight --      Height --      Head Circumference --      Peak Flow --      Pain Score 04/12/21 1855 4     Pain Loc --      Pain Edu? --      Excl. in [place]? --    No data found.  Updated Vital Signs BP (!) 183/84 (BP Location: Left Arm)   Pulse 79   Temp 98.1 F (36.7 C) (Oral)   Resp 18   SpO2 97%   Visual Acuity Right Eye Distance:   Left Eye Distance:   Bilateral Distance:    Right Eye Near:   Left Eye Near:    Bilateral Near:     Physical  Exam Vitals and nursing note reviewed.  Constitutional:      Appearance: Normal appearance. She is not ill-appearing.  HENT:     Head: Atraumatic.  Eyes:     Extraocular Movements: Extraocular movements intact.     Conjunctiva/sclera: Conjunctivae normal.  Cardiovascular:     Rate and Rhythm: Normal rate and regular rhythm.     Pulses: Normal pulses.     Heart sounds: Normal heart sounds.  Pulmonary:     Effort: Pulmonary effort is normal.     Breath sounds: Normal breath sounds.  Musculoskeletal:        General: Swelling present. Normal range of motion.     Cervical back: Normal range of motion and neck supple.     Comments: 2+ edema diffusely left lower extremity below the knee.  She states this is chronic.  Skin:    General: Skin is warm.     Comments: 6 to 7 cm area of hyperpigmentation medial left lower leg, now blistering and serous drainage centrally.  Mildly tender to palpation.  Neurological:     Mental Status: She is alert and oriented to person, place, and time.     Motor: No weakness.     Gait: Gait normal.  Psychiatric:        Mood and Affect: Mood normal.        Thought Content: Thought content normal.        Judgment: Judgment normal.     UC Treatments / Results  Labs (all labs ordered are listed, but only abnormal results are displayed) Labs Reviewed - No data to display  EKG   Radiology No results found.  Procedures Procedures (including critical care time)  Medications Ordered in UC Medications - No data to display  Initial Impression / Assessment and Plan / UC Course  I have reviewed the triage vital signs and the nursing notes.  Pertinent labs & imaging results that were available during my care of the patient were reviewed by me and considered in my medical decision making (see chart for details).     Suspect a venous stasis wound, will start Keflex and Hibiclens to protect from infection and we will wrap with nonstick gauze and Coban.   Discussed good wound care at home and close PCP follow-up.  Recommended compression stocking, leg elevation to keep swelling down to  help with healing process.  Return for worsening symptoms  Final Clinical Impressions(s) / UC Diagnoses   Final diagnoses:  Blister  Leg swelling   Discharge Instructions   None    ED Prescriptions     Medication Sig Dispense Auth. Provider   cephALEXin (KEFLEX) 500 MG capsule Take 1 capsule (500 mg total) by mouth 2 (two) times daily. 14 capsule Volney American, [place]   chlorhexidine (HIBICLENS) 4 % external liquid Apply topically daily as needed. 120 mL Volney American, [place]      PDMP not reviewed this encounter.   Merrie Roof Pink, [place] 04/12/21 [id number]

## 2021-09-21 ENCOUNTER — Encounter: Payer: Self-pay | Admitting: Family

## 2021-09-21 ENCOUNTER — Other Ambulatory Visit: Payer: Self-pay

## 2021-09-21 ENCOUNTER — Ambulatory Visit (INDEPENDENT_AMBULATORY_CARE_PROVIDER_SITE_OTHER): Payer: 59 | Admitting: Family

## 2021-09-21 VITALS — BP 138/86 | HR 87 | Temp 97.2°F | Resp 16 | Ht 67.0 in | Wt 191.2 lb

## 2021-09-21 DIAGNOSIS — Z23 Encounter for immunization: Secondary | ICD-10-CM

## 2021-09-21 DIAGNOSIS — J4540 Moderate persistent asthma, uncomplicated: Secondary | ICD-10-CM

## 2021-09-21 DIAGNOSIS — Z6829 Body mass index (BMI) 29.0-29.9, adult: Secondary | ICD-10-CM | POA: Diagnosis not present

## 2021-09-21 DIAGNOSIS — Z7689 Persons encountering health services in other specified circumstances: Secondary | ICD-10-CM

## 2021-09-21 DIAGNOSIS — Z8543 Personal history of malignant neoplasm of ovary: Secondary | ICD-10-CM

## 2021-09-21 DIAGNOSIS — J310 Chronic rhinitis: Secondary | ICD-10-CM

## 2021-09-21 DIAGNOSIS — E785 Hyperlipidemia, unspecified: Secondary | ICD-10-CM | POA: Insufficient documentation

## 2021-09-21 DIAGNOSIS — E663 Overweight: Secondary | ICD-10-CM | POA: Diagnosis not present

## 2021-09-21 MED ORDER — TETANUS-DIPHTH-ACELL PERTUSSIS 5-2.5-18.5 LF-MCG/0.5 IM SUSP: 0.5000 mL | Freq: Once | INTRAMUSCULAR | 0 refills | Status: AC

## 2021-09-21 NOTE — Patient Instructions (Signed)

## 2021-09-21 NOTE — Progress Notes (Signed)
Provider: Webb Silversmith Jayziah Bankhead FNP-C   Pcp, No  Patient Care Team: Pcp, No as PCP - General Ernst Bowler Gwenith Daily, MD as Consulting Physician (Allergy and Immunology)  Extended Emergency Contact Information Primary Emergency Contact: DeVorce,Mabel          [place], [place] Mobile Phone: [phone] Relation: Sister  Code Status:  Full Code  Goals of care: Advanced Directive information Advanced Directives 09/21/2021  Does Patient Have a Medical Advance Directive? No  Would patient like information on creating a medical advance directive? No - Patient declined     Chief Complaint  Patient presents with   Establish Care    New Patient.     HPI:  Pt is a 66 y.o. female seen today establish care here at [facility] for medical management of chronic diseases.Has a medical history of moderate persistent Asthma ,Hyperlipidemia,Prediabetes ,overweight among others.States general health if good.she tries to eat heart healthy diet with red meat every once in a while but mostly includes beans ,chicken and [place] in her diet.  Asthma - follows up with [facility] [place] [facility] [place].On Albuterol sulfate inhaler, Ciclesonide inhaler,loratadine and Montelukast.Has not required her rescue inhaler.   Hyperlipidemia - previous LDL on chart review chol 244 and LDL was 171 (08/25/2019)  Prediabetes - Hgb A1C on chart 6.0 ( 08/25/2019 ) has changed her diet.does some exercising by walking.also walks at work.   Due for tetanus and Zoster vaccine.discussed getting vaccine at her pharmacy.  Past Medical History:  Diagnosis Date   Asthma    Ovarian cancer [facility])    Past Surgical History:  Procedure Laterality Date   BREAST LUMPECTOMY     COLONOSCOPY  2021   Dr. Juleen China, per new patient form   DIAGNOSTIC MAMMOGRAM  2021   Dr. Raul Del, Per new patient forms   PARTIAL HYSTERECTOMY  1999   Dr. Garwin Brothers    Allergies   Allergen Reactions   Shellfish Allergy Anaphylaxis    Itching/swelling   Benadryl [Diphenhydramine] Hives   Other Cough    Strong Prefumes/Cologne.     Allergies as of 09/21/2021       Reactions   Shellfish Allergy Anaphylaxis   Itching/swelling   Benadryl [diphenhydramine] Hives   Other Cough   Strong Prefumes/Cologne.         Medication List        Accurate as of September 21, 2021  9:04 AM. If you have any questions, ask your nurse or doctor.          Alvesco 160 MCG/ACT inhaler Generic drug: ciclesonide Inhale 2 puffs into the lungs 2 (two) times daily.   Fish Oil 1000 MG Caps Take 2 capsules by mouth daily.   Hibiclens 4 % external liquid Generic drug: chlorhexidine Apply topically daily as needed.   loratadine 10 MG tablet Commonly known as: CLARITIN Take 1 tablet by mouth 1-2 times a day   montelukast 10 MG tablet Commonly known as: SINGULAIR Take 1 tablet (10 mg total) by mouth at bedtime.   OVER THE COUNTER MEDICATION Apply 1 application topically daily. [employer], [employer], and [employer].   ProAir Digihaler 108 (90 Base) MCG/ACT Aepb Generic drug: Albuterol Sulfate (sensor) 4 puffs every 4-6 hours as needed        Review of Systems  Constitutional:  Negative for appetite change, chills, fatigue, fever and unexpected weight change.  HENT:  Negative  for congestion, dental problem, ear discharge, ear pain, facial swelling, hearing loss, nosebleeds, postnasal drip, rhinorrhea, sinus pressure, sinus pain, sneezing, sore throat, tinnitus and trouble swallowing.        Allergies antihistamines effective   Eyes:  Positive for visual disturbance. Negative for pain, discharge, redness and itching.       Wears eye glasses and contacts  follows up with [place] at eye care   Respiratory:  Negative for cough, chest tightness, shortness of breath and wheezing.        [place] follow up with Pulmonary   Cardiovascular:   Negative for chest pain, palpitations and leg swelling.  Gastrointestinal:  Negative for abdominal distention, abdominal pain, blood in stool, constipation, diarrhea, nausea and vomiting.  Endocrine: Negative for cold intolerance, heat intolerance, polydipsia, polyphagia and polyuria.       Night sweat   Genitourinary:  Negative for difficulty urinating, dysuria, flank pain, frequency and urgency.  Musculoskeletal:  Negative for arthralgias, back pain, gait problem, joint swelling, myalgias, neck pain and neck stiffness.  Skin:  Negative for color change, pallor, rash and wound.       Bruise on left ankle   Neurological:  Negative for dizziness, syncope, speech difficulty, weakness, light-headedness, numbness and headaches.  Hematological:  Does not bruise/bleed easily.  Psychiatric/Behavioral:  Negative for agitation, behavioral problems, confusion, hallucinations, self-injury, sleep disturbance and suicidal ideas. The patient is not nervous/anxious.    Immunization History  Administered Date(s) Administered   Influenza Inj Mdck Quad Pf 08/29/2018   Influenza, Quadrivalent, Recombinant, Inj, Pf 06/05/2019   Influenza,inj,Quad PF,6+ Mos 06/07/2020   Influenza-Unspecified 07/01/2021   PFIZER(Purple Top)SARS-COV-2 Vaccination 07/01/2020   Tetanus 09/18/1998   Pertinent  Health Maintenance Due  Topic Date Due   MAMMOGRAM  10/19/2021   INFLUENZA VACCINE  Completed   PAP SMEAR-Modifier  Discontinued   COLONOSCOPY (Pts 45-68yrs Insurance coverage will need to be confirmed)  Discontinued   Fall Risk 07/24/2019 07/24/2019 04/12/2021 09/21/2021  Falls in the past year? - - - 0  Was there an injury with Fall? - - - 0  Fall Risk Category Calculator - - - 0  Fall Risk Category - - - Low  Patient Fall Risk Level Low fall risk Low fall risk Low fall risk Low fall risk  Patient at Risk for Falls Due to - - - No Fall Risks  Fall risk Follow up - - - Falls evaluation completed   Functional Status  Survey:    Vitals:   09/21/21 0855  BP: 138/86  Pulse: 87  Resp: 16  Temp: (!) 97.2 F (36.2 C)  SpO2: 97%  Weight: 191 lb 3.2 oz (86.7 kg)  Height: $Remove'5\' 7"'ncWZYhm$  (1.702 m)   Body mass index is 29.95 kg/m. Physical Exam Vitals reviewed.  Constitutional:      General: She is not in acute distress.    Appearance: Normal appearance. She is normal weight. She is not ill-appearing or diaphoretic.  HENT:     Head: Normocephalic.     Right Ear: Tympanic membrane, ear canal and external ear normal. There is no impacted cerumen.     Left Ear: Tympanic membrane, ear canal and external ear normal. There is no impacted cerumen.     Nose: Nose normal. No congestion or rhinorrhea.     Mouth/Throat:     Mouth: Mucous membranes are moist.     Pharynx: Oropharynx is clear. No oropharyngeal exudate or posterior oropharyngeal erythema.  Eyes:     General:  No scleral icterus.       Right eye: No discharge.        Left eye: No discharge.     Extraocular Movements: Extraocular movements intact.     Conjunctiva/sclera: Conjunctivae normal.     Pupils: Pupils are equal, round, and reactive to light.  Neck:     Vascular: No carotid bruit.  Cardiovascular:     Rate and Rhythm: Normal rate and regular rhythm.     Pulses: Normal pulses.     Heart sounds: Normal heart sounds. No murmur heard.   No friction rub. No gallop.  Pulmonary:     Effort: Pulmonary effort is normal. No respiratory distress.     Breath sounds: Normal breath sounds. No wheezing, rhonchi or rales.  Chest:     Chest wall: No tenderness.  Abdominal:     General: Bowel sounds are normal. There is no distension.     Palpations: Abdomen is soft. There is no mass.     Tenderness: There is no abdominal tenderness. There is no right CVA tenderness, left CVA tenderness, guarding or rebound.  Musculoskeletal:        General: No swelling or tenderness. Normal range of motion.     Cervical back: Normal range of motion. No rigidity or  tenderness.     Right lower leg: No edema.     Left lower leg: No edema.  Lymphadenopathy:     Cervical: No cervical adenopathy.  Skin:    General: Skin is warm and dry.     Coloration: Skin is not pale.     Findings: No bruising, erythema, lesion or rash.  Neurological:     Mental Status: She is alert and oriented to person, place, and time.     Cranial Nerves: No cranial nerve deficit.     Sensory: No sensory deficit.     Motor: No weakness.     Coordination: Coordination normal.     Gait: Gait normal.  Psychiatric:        Mood and Affect: Mood normal.        Speech: Speech normal.        Behavior: Behavior normal.        Thought Content: Thought content normal.        Judgment: Judgment normal.    Labs reviewed: No results for input(s): NA, K, CL, CO2, GLUCOSE, BUN, CREATININE, CALCIUM, MG, PHOS in the last 8760 hours. No results for input(s): AST, ALT, ALKPHOS, BILITOT, PROT, ALBUMIN in the last 8760 hours. No results for input(s): WBC, NEUTROABS, HGB, HCT, MCV, PLT in the last 8760 hours. Lab Results  Component Value Date   TSH 2.230 08/25/2019   Lab Results  Component Value Date   HGBA1C 6.0 (H) 08/25/2019   Lab Results  Component Value Date   CHOL 244 (H) 08/25/2019   HDL 58 08/25/2019   LDLCALC 171 (H) 08/25/2019   TRIG 87 08/25/2019   CHOLHDL 4.2 08/25/2019    Significant Diagnostic Results in last 30 days:  No results found.  Assessment/Plan 1. Encounter to establish care Available medical records reviewed.Due for tetanus and zoster vaccine advised to get both at her Pharmacy.recommended fasting labs today.  2. Need for Tdap vaccination Advised to get Tdap vaccine at the pharmacy. Script send to pharmacy today  - Tdap ([place]) 5-2.5-18.5 LF-MCG/0.5 injection; Inject 0.5 mLs into the muscle once for 1 dose.  Dispense: 0.5 mL; Refill: 0  3. BMI 29.0-29.9,adult BMI 29.95   4.  Overweight with body mass index (BMI) 25.0-29.9 - Dietary modification  and exercise at least 3 times per week for 30 minutes advised. - additional Calorie counting for weight loss Education information provided on AVS  - CBC with Differential/Platelet - CMP with eGFR([facility]) - Lipid Panel  5. Non-allergic rhinitis Continue on loratadine   6. Moderate persistent asthma without complication Continue on to follow up with [facility]ergy [facility] [place] with [place]. Continue on Albuterol sulfate inhaler, Ciclesonide inhaler,loratadine and Montelukast - CBC with Differential/Platelet - CMP with eGFR([facility])  7. Hyperlipidemia LDL goal <100 LDL not at goal per previous records.  - dietary modification and exercise as above  - Lipid Panel  8. History of ovarian cancer S/p partial hysterectomy in 1999 by Dr. Fabio Neighbors Counsins  .  Family/ staff Communication: Reviewed plan of care with patient verbalized understanding.   Labs/tests ordered:  - CBC with Differential/Platelet - CMP with eGFR([facility]) - TSH - Lipid panel  Next Appointment :  1 year for annual Physical exam with fasting labs    Sandrea Hughs, NP

## 2021-09-25 ENCOUNTER — Encounter: Payer: Self-pay | Admitting: Allergy & Immunology

## 2021-09-25 ENCOUNTER — Other Ambulatory Visit: Payer: Self-pay

## 2021-09-25 ENCOUNTER — Ambulatory Visit: Payer: 59 | Admitting: Allergy & Immunology

## 2021-09-25 VITALS — BP 140/82 | HR 90 | Temp 97.8°F | Resp 16 | Ht 67.0 in | Wt 197.1 lb

## 2021-09-25 DIAGNOSIS — J310 Chronic rhinitis: Secondary | ICD-10-CM

## 2021-09-25 DIAGNOSIS — J4540 Moderate persistent asthma, uncomplicated: Secondary | ICD-10-CM | POA: Diagnosis not present

## 2021-09-25 LAB — COMPLETE METABOLIC PANEL WITH GFR
AG Ratio: 1.7 (calc) (ref 1.0–2.5)
ALT: 8 U/L (ref 6–29)
AST: 11 U/L (ref 10–35)
Albumin: 4.5 g/dL (ref 3.6–5.1)
Alkaline phosphatase (APISO): 89 U/L (ref 37–153)
BUN: 11 mg/dL (ref 7–25)
CO2: 30 mmol/L (ref 20–32)
Calcium: 9.6 mg/dL (ref 8.6–10.4)
Chloride: 105 mmol/L (ref 98–110)
Creat: 0.83 mg/dL (ref 0.50–1.05)
Globulin: 2.6 g/dL (calc) (ref 1.9–3.7)
Glucose, Bld: 102 mg/dL — ABNORMAL HIGH (ref 65–99)
Potassium: 4.1 mmol/L (ref 3.5–5.3)
Sodium: 142 mmol/L (ref 135–146)
Total Bilirubin: 0.4 mg/dL (ref 0.2–1.2)
Total Protein: 7.1 g/dL (ref 6.1–8.1)
eGFR: 79 mL/min/{1.73_m2} (ref >=60)

## 2021-09-25 LAB — HEMOGLOBIN A1C
Hgb A1c MFr Bld: 6 % of total Hgb — ABNORMAL HIGH (ref <5.7)
Mean Plasma Glucose: 126 mg/dL
eAG (mmol/L): 7 mmol/L

## 2021-09-25 LAB — CBC WITH DIFFERENTIAL/PLATELET
Absolute Monocytes: 270 cells/uL (ref 200–950)
Basophils Absolute: 28 cells/uL (ref 0–200)
Basophils Relative: 0.5 %
Eosinophils Absolute: 121 cells/uL (ref 15–500)
Eosinophils Relative: 2.2 %
HCT: 39 % (ref 35.0–45.0)
Hemoglobin: 13.1 g/dL (ref 11.7–15.5)
Lymphs Abs: 1870 cells/uL (ref 850–3900)
MCH: 28.9 pg (ref 27.0–33.0)
MCHC: 33.6 g/dL (ref 32.0–36.0)
MCV: 85.9 fL (ref 80.0–100.0)
MPV: 11.1 fL (ref 7.5–12.5)
Monocytes Relative: 4.9 %
Neutro Abs: 3212 cells/uL (ref 1500–7800)
Neutrophils Relative %: 58.4 %
Platelets: 187 10*3/uL (ref 140–400)
RBC: 4.54 10*6/uL (ref 3.80–5.10)
RDW: 12.3 % (ref 11.0–15.0)
Total Lymphocyte: 34 %
WBC: 5.5 10*3/uL (ref 3.8–10.8)

## 2021-09-25 LAB — TEST AUTHORIZATION

## 2021-09-25 LAB — LIPID PANEL
Cholesterol: 237 mg/dL — ABNORMAL HIGH (ref <200)
HDL: 55 mg/dL (ref >=50)
LDL Cholesterol (Calc): 165 mg/dL (calc) — ABNORMAL HIGH
Non-HDL Cholesterol (Calc): 182 mg/dL (calc) — ABNORMAL HIGH (ref <130)
Total CHOL/HDL Ratio: 4.3 (calc) (ref <5.0)
Triglycerides: 72 mg/dL (ref <150)

## 2021-09-25 NOTE — Patient Instructions (Addendum)
1. Cough - Lung testing looks great today. - We will continue with the Alvesco two puffs twice daily (we can consider decreasing to one puff twice daily instead).  - Daily controller medication(s): Alvesco 160mg  two puffs twice daily + Singulair 10mg   - Rescue medications: ProAir Digihaler 4 puffs every 4-6 hours as needed  - Asthma control goals:  * Full participation in all desired activities (may need albuterol before activity) * Albuterol use two time or less a week on average (not counting use with activity) * Cough interfering with sleep two time or less a month * Oral steroids no more than once a year * No hospitalizations  2. Chronic non- rhinitis - Continue with the Singulair 10mg  daily. - Continue with Claritin twice daily.  3. Return in about 6 months (around 03/25/2022).    Please inform us of any Emergency Department visits, hospitalizations, or changes in symptoms. Call us before going to the ED for breathing or allergy symptoms since we might be able to fit you in for a sick visit. Feel free to contact us anytime with any questions, problems, or concerns.  It was a pleasure to see you again today!  Websites that have reliable patient information: 1. American Academy of Asthma, Allergy, and Immunology: www.aaaai.org 2. Food Allergy Research and Education (FARE): foodallergy.org 3. Mothers of Asthmatics: http://www.asthmacommunitynetwork.org 4. American College of Allergy, Asthma, and Immunology: www.acaai.org   COVID-19 Vaccine Information can be found at: ShippingScam.co.uk For questions related to vaccine distribution or appointments, please email vaccine@[email or web address] .com or call [phone].     Like [place] on [place] and Instagram for our latest updates!       Make sure you are registered to vote! If you have moved or changed any of your contact information, you will need to get this updated before  voting!  In some cases, you MAY be able to register to vote online: [email or web address]

## 2021-09-25 NOTE — Progress Notes (Signed)
FOLLOW UP  Date of Service/Encounter:  09/25/21   Assessment:   Moderate persistent asthma, uncomplicated   Non-allergic rhinitis    Shellfish allergy  Plan/Recommendations:   1. Cough - likely mild persistent asthma - Lung testing looks great today. - We will continue with the Alvesco two puffs twice daily (we can consider decreasing to one puff twice daily instead).  - Daily controller medication(s): Alvesco 160mg  two puffs twice daily + Singulair 10mg   - Rescue medications: ProAir Digihaler 4 puffs every 4-6 hours as needed  - Asthma control goals:  * Full participation in all desired activities (may need albuterol before activity) * Albuterol use two time or less a week on average (not counting use with activity) * Cough interfering with sleep two time or less a month * Oral steroids no more than once a year * No hospitalizations  2. Chronic non- rhinitis - Continue with the Singulair 10mg  daily. - Continue with Claritin twice daily.  3. Return in about 6 months (around 03/25/2022).   Subjective:   Rhonda Kirk is a 66 y.o. female presenting today for follow up of  Chief Complaint  Patient presents with   Follow-up    Rhonda Kirk has a history of the following: Patient Active Problem List   Diagnosis Date Noted   Moderate persistent asthma without complication 44/08/270   BMI 29.0-29.9,adult 09/21/2021   Overweight with body mass index (BMI) 25.0-29.9 09/21/2021   Hyperlipidemia LDL goal <100 09/21/2021   History of ovarian cancer 09/21/2021   Cough 03/02/2020   Non-allergic rhinitis 03/02/2020    History obtained from: chart review and patient.  Rhonda Kirk is a 66 y.o. female presenting for a follow up visit. She was last seen in August 2022. At that time, her lung testing looked amazing. We started her on Alvesco two puffs BID of te 151mcg dose. We also continued with the ProAir as needed. For her rhinitis, we continued with montelukast as well as PRN  Claritin. Angioedema has cleared up.  Since the last visit, she has done well. Overall, she has done much better in her new role. She is back in Accounting. She has been doing much less coughing. She is having much less mold exposure to an extent. 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 This is effective as last October 2022. She has been applying to that position for over two years.   Asthma/Respiratory Symptom History: She is doing two puffs twice daily of the Alvesco. This is working very well. She has not needed any systemic steroids at all. She seems to not be needing her rescue inhaler. She has not been to the ED or UC for her symptoms. Overall her symptoms have all but resolved with regular use of her ICS.    Allergic Rhinitis Symptom History: She remains on the Claritin every morning with Claritin at night with the Singulair. She has not needed antibiotics in quite some time.  She has not had any sinus infections or pneumonias at all.   In August, she had a foot injury that needed a course of antibiotics for her symptoms. She had an area on her foot from cleaning the floors that continued to get infected, so she has been using a topical antibiotic to help keep infections at [place].  Holidays were nice and quiet. She continues to make pound cake and she actually brought me one today! It is the lemon variety. She also makes a vanilla one as well.  Otherwise, there have been no changes to her past medical history, surgical history, family history, or social history.    Review of Systems  Constitutional: Negative.  Negative for chills, fever, malaise/fatigue and weight loss.  HENT: Negative.  Negative for congestion, ear discharge, ear pain, sinus pain and sore throat.   Eyes:  Negative for pain, discharge and redness.  Respiratory:  Negative for cough, sputum production, shortness of breath and wheezing.   Cardiovascular: Negative.  Negative for chest pain and palpitations.  Gastrointestinal:   Negative for abdominal pain, constipation, diarrhea, heartburn, nausea and vomiting.  Skin: Negative.  Negative for itching and rash.  Neurological:  Negative for dizziness and headaches.  Endo/Heme/Allergies:  Negative for environmental allergies. Does not bruise/bleed easily.      Objective:   Blood pressure 140/82, pulse 90, temperature 97.8 F (36.6 C), resp. rate 16, height 5\' 7"  (1.702 m), weight 197 lb 2 oz (89.4 kg), SpO2 98 %. Body mass index is 30.87 kg/m.   Physical Exam:  Physical Exam Vitals reviewed.  Constitutional:      Appearance: She is well-developed.     Comments: Very pleasant and talkative. She is feeling quite positive today. Her attitude is very upbeat.  HENT:     Head: Normocephalic and atraumatic.     Right Ear: Tympanic membrane, ear canal and external ear normal.     Left Ear: Tympanic membrane, ear canal and external ear normal.     Nose: No nasal deformity, septal deviation, mucosal edema or rhinorrhea.     Right Turbinates: Not enlarged or swollen.     Left Turbinates: Not enlarged or swollen.     Right Sinus: No maxillary sinus tenderness or frontal sinus tenderness.     Left Sinus: No maxillary sinus tenderness or frontal sinus tenderness.     Mouth/Throat:     Mouth: Mucous membranes are not pale and not dry.     Pharynx: Uvula midline.  Eyes:     General: Lids are normal. No allergic shiner.       Right eye: No discharge.        Left eye: No discharge.     Conjunctiva/sclera: Conjunctivae normal.     Right eye: Right conjunctiva is not injected. No chemosis.    Left eye: Left conjunctiva is not injected. No chemosis.    Pupils: Pupils are equal, round, and reactive to light.  Cardiovascular:     Rate and Rhythm: Normal rate and regular rhythm.     Heart sounds: Normal heart sounds.  Pulmonary:     Effort: Pulmonary effort is normal. No tachypnea, accessory muscle usage or respiratory distress.     Breath sounds: Normal breath sounds.  No wheezing, rhonchi or rales.     Comments: Breathing comfortably. Chest:     Chest wall: No tenderness.  Lymphadenopathy:     Cervical: No cervical adenopathy.  Skin:    General: Skin is warm.     Capillary Refill: Capillary refill takes less than 2 seconds.     Coloration: Skin is not pale.     Findings: No abrasion, erythema, petechiae or rash. Rash is not papular, urticarial or vesicular.     Comments: No eczematous or urticarial lesions noted.  Neurological:     Mental Status: She is alert.  Psychiatric:        Behavior: Behavior is cooperative.     Diagnostic studies:    Spirometry: results abnormal (FEV1: 1.66/73%, FVC: 1.95/67%, FEV1/FVC: 85%).  Spirometry consistent with possible restrictive disease. Xopenex four puffs via MDI treatment given in clinic with no improvement.  Allergy Studies: none           Salvatore Marvel, MD  [facility]

## 2021-09-26 ENCOUNTER — Encounter: Payer: Self-pay | Admitting: Allergy & Immunology

## 2021-09-26 ENCOUNTER — Other Ambulatory Visit: Payer: Self-pay | Admitting: Allergy & Immunology

## 2021-09-26 MED ORDER — MONTELUKAST SODIUM 10 MG PO TABS: 10.0000 mg | ORAL_TABLET | Freq: Every day | ORAL | 5 refills | Status: DC

## 2021-09-26 MED ORDER — ALVESCO 160 MCG/ACT IN AERS: 2.0000 | INHALATION_SPRAY | Freq: Two times a day (BID) | RESPIRATORY_TRACT | 5 refills | Status: DC

## 2021-09-26 MED ORDER — PROAIR DIGIHALER 108 (90 BASE) MCG/ACT IN AEPB: INHALATION_SPRAY | RESPIRATORY_TRACT | 1 refills | Status: DC

## 2021-09-26 NOTE — Telephone Encounter (Signed)
Please advise of change from alvesco to pulmicort per insurance?

## 2021-09-27 ENCOUNTER — Other Ambulatory Visit: Payer: Self-pay | Admitting: *Deleted

## 2021-09-27 DIAGNOSIS — E785 Hyperlipidemia, unspecified: Secondary | ICD-10-CM

## 2021-09-27 DIAGNOSIS — R739 Hyperglycemia, unspecified: Secondary | ICD-10-CM

## 2021-10-10 ENCOUNTER — Other Ambulatory Visit: Payer: Self-pay | Admitting: Allergy & Immunology

## 2021-10-11 ENCOUNTER — Ambulatory Visit: Payer: 59 | Admitting: Allergy & Immunology

## 2022-02-01 ENCOUNTER — Other Ambulatory Visit: Payer: Self-pay | Admitting: Family

## 2022-02-01 DIAGNOSIS — R739 Hyperglycemia, unspecified: Secondary | ICD-10-CM

## 2022-02-01 DIAGNOSIS — E785 Hyperlipidemia, unspecified: Secondary | ICD-10-CM

## 2022-02-04 ENCOUNTER — Other Ambulatory Visit: Payer: 59

## 2022-02-05 LAB — LIPID PANEL
Cholesterol: 228 mg/dL — ABNORMAL HIGH (ref <200)
HDL: 63 mg/dL (ref >=50)
LDL Cholesterol (Calc): 150 mg/dL (calc) — ABNORMAL HIGH
Non-HDL Cholesterol (Calc): 165 mg/dL (calc) — ABNORMAL HIGH (ref <130)
Total CHOL/HDL Ratio: 3.6 (calc) (ref <5.0)
Triglycerides: 54 mg/dL (ref <150)

## 2022-02-05 LAB — COMPLETE METABOLIC PANEL WITH GFR
AG Ratio: 1.8 (calc) (ref 1.0–2.5)
ALT: 12 U/L (ref 6–29)
AST: 15 U/L (ref 10–35)
Albumin: 4.3 g/dL (ref 3.6–5.1)
Alkaline phosphatase (APISO): 79 U/L (ref 37–153)
BUN: 7 mg/dL (ref 7–25)
CO2: 27 mmol/L (ref 20–32)
Calcium: 9.1 mg/dL (ref 8.6–10.4)
Chloride: 108 mmol/L (ref 98–110)
Creat: 0.83 mg/dL (ref 0.50–1.05)
Globulin: 2.4 g/dL (calc) (ref 1.9–3.7)
Glucose, Bld: 104 mg/dL — ABNORMAL HIGH (ref 65–99)
Potassium: 3.8 mmol/L (ref 3.5–5.3)
Sodium: 143 mmol/L (ref 135–146)
Total Bilirubin: 0.6 mg/dL (ref 0.2–1.2)
Total Protein: 6.7 g/dL (ref 6.1–8.1)
eGFR: 79 mL/min/{1.73_m2} (ref >=60)

## 2022-02-05 LAB — HEMOGLOBIN A1C
Hgb A1c MFr Bld: 5.9 % of total Hgb — ABNORMAL HIGH (ref <5.7)
Mean Plasma Glucose: 123 mg/dL
eAG (mmol/L): 6.8 mmol/L

## 2022-02-06 ENCOUNTER — Other Ambulatory Visit: Payer: Self-pay

## 2022-02-06 DIAGNOSIS — E785 Hyperlipidemia, unspecified: Secondary | ICD-10-CM

## 2022-03-26 ENCOUNTER — Ambulatory Visit: Payer: 59 | Admitting: Allergy & Immunology

## 2022-04-05 ENCOUNTER — Other Ambulatory Visit: Payer: Self-pay | Admitting: Allergy & Immunology

## 2022-04-07 ENCOUNTER — Encounter: Payer: Self-pay | Admitting: Allergy & Immunology

## 2022-04-08 ENCOUNTER — Ambulatory Visit
Admission: EM | Admit: 2022-04-08 | Discharge: 2022-04-08 | Disposition: A | Discharge status: Home / Self Care | Payer: 59 | Attending: Internal Medicine | Admitting: Internal Medicine

## 2022-04-08 ENCOUNTER — Encounter: Payer: Self-pay | Admitting: Emergency Medicine

## 2022-04-08 ENCOUNTER — Telehealth: Payer: Self-pay

## 2022-04-08 ENCOUNTER — Other Ambulatory Visit: Payer: Self-pay | Admitting: Allergy & Immunology

## 2022-04-08 DIAGNOSIS — U071 COVID-19: Secondary | ICD-10-CM | POA: Diagnosis not present

## 2022-04-08 MED ORDER — PAXLOVID (300/100) 20 X 150 MG & 10 X 100MG PO TBPK: 1.0000 | ORAL_TABLET | ORAL | 0 refills | Status: DC

## 2022-04-08 NOTE — Telephone Encounter (Signed)
noted 

## 2022-04-08 NOTE — ED Provider Notes (Signed)
[facility]    CSN: [id number] Arrival date & time: 04/08/22  1031      History   Chief Complaint Chief Complaint  Patient presents with   Cough   Generalized Body Aches    HPI Rhonda Kirk is a 66 y.o. female.   Patient presents today for further evaluation after testing positive for COVID-19.  Patient reports symptoms started about 4 days prior and include nasal congestion, cough, generalized body aches, sore throat.  Patient reports that she also had some blood-tinged sputum with productive cough that is now resolved.  Denies any associated fevers or sick contacts.  Denies chest pain, shortness of breath, nausea, vomiting, diarrhea, abdominal pain.  Patient took 2 COVID tests at home at day 2 and 3 of symptoms that were both positive.  Patient does have a history of asthma.   Cough   Past Medical History:  Diagnosis Date   Asthma    Ovarian cancer [facility])     Patient Active Problem List   Diagnosis Date Noted   Moderate persistent asthma, uncomplicated 38/75/6433   BMI 29.0-29.9,adult 09/21/2021   Overweight with body mass index (BMI) 25.0-29.9 09/21/2021   Hyperlipidemia LDL goal <100 09/21/2021   History of ovarian cancer 09/21/2021   Cough 03/02/2020   Non-allergic rhinitis 03/02/2020    Past Surgical History:  Procedure Laterality Date   BREAST LUMPECTOMY     COLONOSCOPY  2021   Dr. Juleen China, per new patient form   DIAGNOSTIC MAMMOGRAM  2021   Dr. Raul Del, Per new patient forms   PARTIAL HYSTERECTOMY  1999   Dr. Garwin Brothers    OB History   No obstetric history on file.      Home Medications    Prior to Admission medications   Medication Sig Start Date End Date Taking? Authorizing Provider  nirmatrelvir & ritonavir (PAXLOVID, 300/100,) 20 x 150 MG & 10 x '100MG'$  TBPK Take 1 tablet by mouth See admin instructions. Take as directed 04/08/22  Yes Hadley Detloff, Michele Rockers, FNP  Albuterol Sulfate, sensor, (PROAIR DIGIHALER) 108 (90 Base) MCG/ACT AEPB 4  puffs every 4-6 hours as needed 09/26/21   Valentina Shaggy, MD  budesonide (PULMICORT FLEXHALER) 180 MCG/ACT inhaler Inhale 2 puffs into the lungs in the morning and at bedtime. 10/03/21 11/02/21  Valentina Shaggy, MD  chlorhexidine (HIBICLENS) 4 % external liquid Apply topically daily as needed. 04/12/21   Volney American, PA-C  loratadine (CLARITIN) 10 MG tablet TAKE 1 TABLET BY MOUTH 1 OR 2 TIMES DAILY 04/05/22   Valentina Shaggy, MD  montelukast (SINGULAIR) 10 MG tablet Take 1 tablet (10 mg total) by mouth at bedtime. 09/26/21   Valentina Shaggy, MD  Omega-3 Fatty Acids (FISH OIL) 1000 MG CAPS Take 2 capsules by mouth daily.    [provider]  OVER THE COUNTER MEDICATION Apply 1 application topically daily. [employer], [employer], and [employer].    [provider]    Family History Family History  Problem Relation Age of Onset   Hypertension Mother    Diabetes Mother    Diabetes Father    Diabetes Sister    Hypertension Sister    Kidney disease Sister        Double mastectomy   Heart failure Sister    Hypertension Sister    Asthma Sister    Asthma Son     Social History Social History   Tobacco Use   Smoking status:  Never   Smokeless tobacco: Never  Vaping Use   Vaping Use: Never used  Substance Use Topics   Alcohol use: Never   Drug use: Never     Allergies   Shellfish allergy, Benadryl [diphenhydramine], and Other   Review of Systems Review of Systems Per HPI  Physical Exam Triage Vital Signs ED Triage Vitals  Enc Vitals Group     BP 04/08/22 1052 (!) 131/98     Pulse Rate 04/08/22 1052 77     Resp 04/08/22 1052 18     Temp 04/08/22 1052 98.1 F (36.7 C)     Temp src --      SpO2 04/08/22 1052 96 %     Weight --      Height --      Head Circumference --      Peak Flow --      Pain Score 04/08/22 1054 0     Pain Loc --      Pain Edu? --      Excl. in [place]? --    No data  found.  Updated Vital Signs BP (!) 131/98   Pulse 77   Temp 98.1 F (36.7 C)   Resp 18   SpO2 96%   Visual Acuity Right Eye Distance:   Left Eye Distance:   Bilateral Distance:    Right Eye Near:   Left Eye Near:    Bilateral Near:     Physical Exam Constitutional:      General: She is not in acute distress.    Appearance: Normal appearance. She is not toxic-appearing or diaphoretic.  HENT:     Head: Normocephalic and atraumatic.     Right Ear: Tympanic membrane and ear canal normal.     Left Ear: Tympanic membrane and ear canal normal.     Nose: Congestion present.     Mouth/Throat:     Mouth: Mucous membranes are moist.     Pharynx: No posterior oropharyngeal erythema.  Eyes:     Extraocular Movements: Extraocular movements intact.     Conjunctiva/sclera: Conjunctivae normal.     Pupils: Pupils are equal, round, and reactive to light.  Cardiovascular:     Rate and Rhythm: Normal rate and regular rhythm.     Pulses: Normal pulses.     Heart sounds: Normal heart sounds.  Pulmonary:     Effort: Pulmonary effort is normal. No respiratory distress.     Breath sounds: Normal breath sounds. No stridor. No wheezing, rhonchi or rales.  Abdominal:     General: Abdomen is flat. Bowel sounds are normal.     Palpations: Abdomen is soft.  Musculoskeletal:        General: Normal range of motion.     Cervical back: Normal range of motion.  Skin:    General: Skin is warm and dry.  Neurological:     General: No focal deficit present.     Mental Status: She is alert and oriented to person, place, and time. Mental status is at baseline.  Psychiatric:        Mood and Affect: Mood normal.        Behavior: Behavior normal.      UC Treatments / Results  Labs (all labs ordered are listed, but only abnormal results are displayed) Labs Reviewed - No data to display  EKG   Radiology No results found.  Procedures Procedures (including critical care time)  Medications  Ordered in UC Medications - No data  to display  Initial Impression / Assessment and Plan / UC Course  I have reviewed the triage vital signs and the nursing notes.  Pertinent labs & imaging results that were available during my care of the patient were reviewed by me and considered in my medical decision making (see chart for details).     Patient had 2 COVID tests at home that were positive.  Suggested chest x-ray given possible blood in sputum but patient declined.  Risks associated with not doing x-ray were discussed with patient.  Patient voiced understanding.  Patient requesting antiviral medications.  GFR appears normal so patient will qualify for Paxlovid.  Paxlovid sent for patient.  Confirmed with pharmacy that dosage of medication was sent over correctly.  Patient was given strict return and ER precautions.  Patient verbalized understanding and was agreeable with plan. Final Clinical Impressions(s) / UC Diagnoses   Final diagnoses:  [id number]     Discharge Instructions      You have been prescribed Paxlovid which is an antiviral for COVID-19.  Please follow-up if symptoms persist or worsen.    ED Prescriptions     Medication Sig Dispense Auth. Provider   nirmatrelvir & ritonavir (PAXLOVID, 300/100,) 20 x 150 MG & 10 x '100MG'$  TBPK Take 1 tablet by mouth See admin instructions. Take as directed 30 tablet [place], Michele Rockers, [place]      PDMP not reviewed this encounter.   Teodora Medici, [place] 04/08/22 1155

## 2022-04-08 NOTE — Discharge Instructions (Signed)
You have been prescribed Paxlovid which is an antiviral for COVID-19.  Please follow-up if symptoms persist or worsen.

## 2022-04-08 NOTE — ED Triage Notes (Signed)
Pt is present today with c/o cough and body aches. Pt sx started Friday. Pt states that she tested positive for covid sat and Sunday

## 2022-04-08 NOTE — Telephone Encounter (Signed)
Patient left voicemail on clinical intake  stating that she started having a coughing spell Friday when she got home she started having flu like symptoms. Patient states that she is also asthmatic. I returned patient called and she informed me that she was going to go to urgent care.  Message routed to Marlowe Sax, NP [facility])

## 2022-04-16 ENCOUNTER — Encounter: Payer: Self-pay | Admitting: Allergy & Immunology

## 2022-04-16 ENCOUNTER — Other Ambulatory Visit: Payer: Self-pay | Admitting: Allergy & Immunology

## 2022-04-16 ENCOUNTER — Ambulatory Visit: Payer: 59 | Admitting: Allergy & Immunology

## 2022-04-16 VITALS — BP 144/66 | HR 93 | Temp 97.6°F | Resp 12 | Wt 182.0 lb

## 2022-04-16 DIAGNOSIS — T783XXD Angioneurotic edema, subsequent encounter: Secondary | ICD-10-CM

## 2022-04-16 DIAGNOSIS — J310 Chronic rhinitis: Secondary | ICD-10-CM | POA: Diagnosis not present

## 2022-04-16 DIAGNOSIS — J4540 Moderate persistent asthma, uncomplicated: Secondary | ICD-10-CM | POA: Diagnosis not present

## 2022-04-16 MED ORDER — MONTELUKAST SODIUM 10 MG PO TABS: 10.0000 mg | ORAL_TABLET | Freq: Every day | ORAL | 5 refills | Status: DC

## 2022-04-16 MED ORDER — PROAIR DIGIHALER 108 (90 BASE) MCG/ACT IN AEPB: INHALATION_SPRAY | RESPIRATORY_TRACT | 1 refills | Status: DC

## 2022-04-16 MED ORDER — PULMICORT FLEXHALER 180 MCG/ACT IN AEPB: 2.0000 | INHALATION_SPRAY | Freq: Two times a day (BID) | RESPIRATORY_TRACT | 5 refills | Status: DC

## 2022-04-16 NOTE — Patient Instructions (Addendum)
1. Cough - Lung testing not done today. - I am glad that you have mad a good recovery!  - Daily controller medication(s): Pulmicort 159mg two puffs twice daily + Singulair '10mg'$   - Rescue medications: albuterol 4 puffs every 4-6 hours as needed  - Asthma control goals:  * Full participation in all desired activities (may need albuterol before activity) * Albuterol use two time or less a week on average (not counting use with activity) * Cough interfering with sleep two time or less a month * Oral steroids no more than once a year * No hospitalizations  2. Chronic non- rhinitis - Continue with the Singulair '10mg'$  daily. - Continue with Claritin twice daily.  3. Return in about 6 months (around 10/17/2022).    Please inform [place] any Emergency Department visits, hospitalizations, or changes in symptoms. Call [place] going to the ED for breathing or allergy symptoms since we might be able to fit you in for a sick visit. Feel free to contact [place]time with any questions, problems, or concerns.  It was a pleasure to see you again today!  Websites that have reliable patient information: 1. American Academy of Asthma, Allergy, and Immunology: www.aaaai.org 2. Food Allergy Research and Education (FARE): foodallergy.org 3. Mothers of Asthmatics: http://www.asthmacommunitynetwork.org 4. American College of Allergy, Asthma, and Immunology: www.acaai.org   COVID-19 Vaccine Information can be found at: [email or web address] questions related to vaccine distribution or appointments, please email [email or web address] or call 32626693843     "Like" [facility] Facebook and Instagram for our latest updates!       Make sure you are registered to vote! If you have moved or changed any of your contact information, you will need to get this updated before voting!  In some cases, you MAY be able to register to vote online:  [email or web address]

## 2022-04-16 NOTE — Progress Notes (Signed)
FOLLOW UP  Date of Service/Encounter:  04/16/22   Assessment:   Moderate persistent asthma, uncomplicated   Non-allergic rhinitis    Shellfish allergy  [employer] filet o-fish lover  Lover of westerns  Plan/Recommendations:   1. Cough - Lung testing not done today. - I am glad that you have mad a good recovery!  - Daily controller medication(s): Pulmicort 129mg two puffs twice daily + Singulair '10mg'$   - Rescue medications: albuterol 4 puffs every 4-6 hours as needed  - Asthma control goals:  * Full participation in all desired activities (may need albuterol before activity) * Albuterol use two time or less a week on average (not counting use with activity) * Cough interfering with sleep two time or less a month * Oral steroids no more than once a year * No hospitalizations  2. Chronic non- rhinitis - Continue with the Singulair '10mg'$  daily. - Continue with Claritin twice daily.  3. Return in about 6 months (around 10/17/2022).   Subjective:   Rhonda Kirk a 66y.o. female presenting today for follow up of  Chief Complaint  Patient presents with   Asthma    Only triggered with loud cologne and perfume, cigarette smoke    Other    Had the flu and Covid 19 last week.    Rhonda Kirk a history of the following: Patient Active Problem List   Diagnosis Date Noted   Moderate persistent asthma, uncomplicated 032/35/5732  BMI 29.0-29.9,adult 09/21/2021   Overweight with body mass index (BMI) 25.0-29.9 09/21/2021   Hyperlipidemia LDL goal <100 09/21/2021   History of ovarian cancer 09/21/2021   Cough 03/02/2020   Non-allergic rhinitis 03/02/2020    History obtained from: chart review and patient.  Rhonda Kirk a 66y.o. female presenting for a follow up visit.  She was last seen in February 2023.  At that time, her lung testing looked great.  We continued Alvesco 160 mcg 2 puffs twice daily as well as Singulair 10 mg daily.  For her rhinitis, would continue  with Singulair and Claritin.  Since last visit, she has mostly done well.  She was in the ER 1 week ago with COVID-19.  She was started on Paxlovid. Sh was also positive for influenza as well, although she did not get Tamiflu.   Asthma/Respiratory Symptom History: Sometime since the last visit, her Alvesco was switched to Pulmicort.  She is currently doing Pulmicort 180 mcg 2 puffs twice daily.  This seems to be doing the trick.  She is also on Singulair 10 mg daily . She has not been to the ED for her symptoms.  She has not been on prednisone.  Even while she had COVID-19, her breathing was under fair control. This most recent episode she feels was triggered by the strong perfume of a coworker.   Allergic Rhinitis Symptom History: She remains on the Claritin twice daily as well as Singulair.  Other antihistamines tend to cause her a lot of sedation.  She has not been on antibiotics at all.  Work is going well. She has been there for around 4 years now. She mostly gets along with everyone at her workplace.   Otherwise, there have been no changes to her past medical history, surgical history, family history, or social history.    Review of Systems  Constitutional: Negative.  Negative for chills, fever, malaise/fatigue and weight loss.  HENT: Negative.  Negative for congestion, ear discharge, ear pain and sinus pain.  Eyes:  Negative for pain, discharge and redness.  Respiratory:  Negative for cough, sputum production, shortness of breath and wheezing.   Cardiovascular: Negative.  Negative for chest pain and palpitations.  Gastrointestinal:  Negative for abdominal pain, constipation, diarrhea, heartburn, nausea and vomiting.  Skin: Negative.  Negative for itching and rash.  Neurological:  Negative for dizziness and headaches.  Endo/Heme/Allergies:  Negative for environmental allergies. Does not bruise/bleed easily.       Objective:   Blood pressure (!) 144/66, pulse 93, temperature 97.6  F (36.4 C), temperature source Temporal, resp. rate 12, weight 182 lb (82.6 kg), SpO2 98 %. Body mass index is 28.51 kg/m.    Physical Exam Vitals reviewed.  Constitutional:      Appearance: She is well-developed.  HENT:     Head: Normocephalic and atraumatic.     Right Ear: Tympanic membrane, ear canal and external ear normal. No drainage, swelling or tenderness. Tympanic membrane is not injected, scarred, erythematous, retracted or bulging.     Left Ear: Tympanic membrane, ear canal and external ear normal. No drainage, swelling or tenderness. Tympanic membrane is not injected, scarred, erythematous, retracted or bulging.     Nose: No nasal deformity, septal deviation, mucosal edema or rhinorrhea.     Right Turbinates: Enlarged, swollen and pale.     Left Turbinates: Enlarged, swollen and pale.     Right Sinus: No maxillary sinus tenderness or frontal sinus tenderness.     Left Sinus: No maxillary sinus tenderness or frontal sinus tenderness.     Comments: No nasal polyps noted.     Mouth/Throat:     Mouth: Mucous membranes are not pale and not dry.     Pharynx: Uvula midline.  Eyes:     General:        Right eye: No discharge.        Left eye: No discharge.     Conjunctiva/sclera: Conjunctivae normal.     Right eye: Right conjunctiva is not injected. No chemosis.    Left eye: Left conjunctiva is not injected. No chemosis.    Pupils: Pupils are equal, round, and reactive to light.  Cardiovascular:     Rate and Rhythm: Normal rate and regular rhythm.     Heart sounds: Normal heart sounds.  Pulmonary:     Effort: Pulmonary effort is normal. No tachypnea, accessory muscle usage or respiratory distress.     Breath sounds: Normal breath sounds. No wheezing, rhonchi or rales.     Comments: Coarse upper airway sounds.  Chest:     Chest wall: No tenderness.  Abdominal:     Tenderness: There is no abdominal tenderness. There is no guarding or rebound.  Lymphadenopathy:     Head:      Right side of head: No submandibular, tonsillar or occipital adenopathy.     Left side of head: No submandibular, tonsillar or occipital adenopathy.     Cervical: No cervical adenopathy.  Skin:    Coloration: Skin is not pale.     Findings: No abrasion, erythema, petechiae or rash. Rash is not papular, urticarial or vesicular.  Neurological:     Mental Status: She is alert.  Psychiatric:        Behavior: Behavior is cooperative.      Diagnostic studies: none (patient refused)      Salvatore Marvel, MD  [facility]

## 2022-04-17 ENCOUNTER — Encounter: Payer: Self-pay | Admitting: Allergy & Immunology

## 2022-06-28 ENCOUNTER — Other Ambulatory Visit: Payer: 59

## 2022-06-28 DIAGNOSIS — E785 Hyperlipidemia, unspecified: Secondary | ICD-10-CM

## 2022-06-28 LAB — LIPID PANEL
Cholesterol: 250 mg/dL — ABNORMAL HIGH (ref <200)
HDL: 64 mg/dL (ref >=50)
LDL Cholesterol (Calc): 170 mg/dL (calc) — ABNORMAL HIGH
Non-HDL Cholesterol (Calc): 186 mg/dL (calc) — ABNORMAL HIGH (ref <130)
Total CHOL/HDL Ratio: 3.9 (calc) (ref <5.0)
Triglycerides: 68 mg/dL (ref <150)

## 2022-07-04 ENCOUNTER — Other Ambulatory Visit: Payer: Self-pay | Admitting: Allergy & Immunology

## 2022-07-25 ENCOUNTER — Encounter: Payer: Self-pay | Admitting: Allergy & Immunology

## 2022-07-25 MED ORDER — ARNUITY ELLIPTA 200 MCG/ACT IN AEPB: 1.0000 | INHALATION_SPRAY | Freq: Every day | RESPIRATORY_TRACT | 2 refills | Status: AC

## 2022-09-09 ENCOUNTER — Other Ambulatory Visit: Payer: Self-pay | Admitting: Allergy & Immunology

## 2022-09-27 ENCOUNTER — Encounter: Payer: Self-pay | Admitting: Family

## 2022-09-27 ENCOUNTER — Ambulatory Visit: Payer: 59 | Admitting: Family

## 2022-09-27 VITALS — BP 127/88 | HR 72 | Temp 98.1°F | Resp 18 | Ht 67.0 in | Wt 180.4 lb

## 2022-09-27 DIAGNOSIS — Z1231 Encounter for screening mammogram for malignant neoplasm of breast: Secondary | ICD-10-CM

## 2022-09-27 DIAGNOSIS — E785 Hyperlipidemia, unspecified: Secondary | ICD-10-CM | POA: Diagnosis not present

## 2022-09-27 DIAGNOSIS — E663 Overweight: Secondary | ICD-10-CM

## 2022-09-27 DIAGNOSIS — J4540 Moderate persistent asthma, uncomplicated: Secondary | ICD-10-CM | POA: Diagnosis not present

## 2022-09-27 DIAGNOSIS — Z780 Asymptomatic menopausal state: Secondary | ICD-10-CM

## 2022-09-27 DIAGNOSIS — Z0000 Encounter for general adult medical examination without abnormal findings: Secondary | ICD-10-CM | POA: Diagnosis not present

## 2022-09-27 DIAGNOSIS — R7303 Prediabetes: Secondary | ICD-10-CM

## 2022-09-27 NOTE — Patient Instructions (Signed)
- Please get Tetanus vaccine at the Pharmacy.  - Also verify with the pharmacy the date of your last shingles vaccine   DASH Eating Plan DASH stands for Dietary Approaches to Stop Hypertension. The DASH eating plan is a healthy eating plan that has been shown to: Reduce high blood pressure (hypertension). Reduce your risk for type 2 diabetes, heart disease, and stroke. Help with weight loss. What are tips for following this plan? Reading food labels Check food labels for the amount of salt (sodium) per serving. Choose foods with less than 5 percent of the Daily Value of sodium. Generally, foods with less than 300 milligrams (mg) of sodium per serving fit into this eating plan. To find whole grains, look for the word "whole" as the first word in the ingredient list. Shopping Buy products labeled as "low-sodium" or "no salt added." Buy fresh foods. Avoid canned foods and pre-made or frozen meals. Cooking Avoid adding salt when cooking. Use salt-free seasonings or herbs instead of table salt or sea salt. Check with your health care provider or pharmacist before using salt substitutes. Do not fry foods. Cook foods using healthy methods such as baking, boiling, grilling, roasting, and broiling instead. Cook with heart-healthy oils, such as olive, canola, avocado, soybean, or sunflower oil. Meal planning  Eat a balanced diet that includes: 4 or more servings of fruits and 4 or more servings of vegetables each day. Try to fill one-half of your plate with fruits and vegetables. 6-8 servings of whole grains each day. Less than 6 oz (170 g) of lean meat, poultry, or fish each day. A 3-oz (85-g) serving of meat is about the same size as a deck of cards. One egg equals 1 oz (28 g). 2-3 servings of low-fat dairy each day. One serving is 1 cup (237 mL). 1 serving of nuts, seeds, or beans 5 times each week. 2-3 servings of heart-healthy fats. Healthy fats called omega-3 fatty acids are found in foods  such as walnuts, flaxseeds, fortified milks, and eggs. These fats are also found in cold-water fish, such as sardines, salmon, and mackerel. Limit how much you eat of: Canned or prepackaged foods. Food that is high in trans fat, such as some fried foods. Food that is high in saturated fat, such as fatty meat. Desserts and other sweets, sugary drinks, and other foods with added sugar. Full-fat dairy products. Do not salt foods before eating. Do not eat more than 4 egg yolks a week. Try to eat at least 2 vegetarian meals a week. Eat more home-cooked food and less restaurant, buffet, and fast food. Lifestyle When eating at a restaurant, ask that your food be prepared with less salt or no salt, if possible. If you drink alcohol: Limit how much you use to: 0-1 drink a day for women who are not pregnant. 0-2 drinks a day for men. Be aware of how much alcohol is in your drink. In the [place] one drink equals one 12 oz bottle of beer (355 mL), one 5 oz glass of wine (148 mL), or one 1 oz glass of hard liquor (44 mL). General information Avoid eating more than 2,300 mg of salt a day. If you have hypertension, you may need to reduce your sodium intake to 1,500 mg a day. Work with your health care provider to maintain a healthy body weight or to lose weight. Ask what an ideal weight is for you. Get at least 30 minutes of exercise that causes your heart to beat  faster (aerobic exercise) most days of the week. Activities may include walking, swimming, or biking. Work with your health care provider or dietitian to adjust your eating plan to your individual calorie needs. What foods should I eat? Fruits All fresh, dried, or frozen fruit. Canned fruit in natural juice (without added sugar). Vegetables Fresh or frozen vegetables (raw, steamed, roasted, or grilled). Low-sodium or reduced-sodium tomato and vegetable juice. Low-sodium or reduced-sodium tomato sauce and tomato paste. Low-sodium or  reduced-sodium canned vegetables. Grains Whole-grain or whole-wheat bread. Whole-grain or whole-wheat pasta. Brown rice. Rhonda Kirk. Bulgur. Whole-grain and low-sodium cereals. Pita bread. Low-fat, low-sodium crackers. Whole-wheat flour tortillas. Meats and other proteins Skinless chicken or [place]. Ground chicken or [place]. Pork with fat trimmed off. Fish and seafood. Egg whites. Dried beans, peas, or lentils. Unsalted nuts, nut butters, and seeds. Unsalted canned beans. Lean cuts of beef with fat trimmed off. Low-sodium, lean precooked or cured meat, such as sausages or meat loaves. Dairy Low-fat (1%) or fat-free (skim) milk. Reduced-fat, low-fat, or fat-free cheeses. Nonfat, low-sodium ricotta or cottage cheese. Low-fat or nonfat yogurt. Low-fat, low-sodium cheese. Fats and oils Soft margarine without trans fats. Vegetable oil. Reduced-fat, low-fat, or light mayonnaise and salad dressings (reduced-sodium). Canola, safflower, olive, avocado, soybean, and sunflower oils. Avocado. Seasonings and condiments Herbs. Spices. Seasoning mixes without salt. Other foods Unsalted popcorn and pretzels. Fat-free sweets. The items listed above may not be a complete list of foods and beverages you can eat. Contact a dietitian for more information. What foods should I avoid? Fruits Canned fruit in a light or heavy syrup. Fried fruit. Fruit in cream or butter sauce. Vegetables Creamed or fried vegetables. Vegetables in a cheese sauce. Regular canned vegetables (not low-sodium or reduced-sodium). Regular canned tomato sauce and paste (not low-sodium or reduced-sodium). Regular tomato and vegetable juice (not low-sodium or reduced-sodium). Rhonda Kirk. Olives. Grains Baked goods made with fat, such as croissants, muffins, or some breads. Dry pasta or rice meal packs. Meats and other proteins Fatty cuts of meat. Ribs. Fried meat. Rhonda Kirk. Bologna, salami, and other precooked or cured meats, such as sausages or  meat loaves. Fat from the back of a pig (fatback). Bratwurst. Salted nuts and seeds. Canned beans with added salt. Canned or smoked fish. Whole eggs or egg yolks. Chicken or [place] with skin. Dairy Whole or 2% milk, cream, and half-and-half. Whole or full-fat cream cheese. Whole-fat or sweetened yogurt. Full-fat cheese. Nondairy creamers. Whipped toppings. Processed cheese and cheese spreads. Fats and oils Butter. Stick margarine. Lard. Shortening. Ghee. Bacon fat. Tropical oils, such as coconut, palm kernel, or palm oil. Seasonings and condiments Onion salt, garlic salt, seasoned salt, table salt, and sea salt. Worcestershire sauce. Tartar sauce. Barbecue sauce. Teriyaki sauce. Soy sauce, including reduced-sodium. Steak sauce. Canned and packaged gravies. Fish sauce. Oyster sauce. Cocktail sauce. Store-bought horseradish. Ketchup. Mustard. Meat flavorings and tenderizers. Bouillon cubes. Hot sauces. Pre-made or packaged marinades. Pre-made or packaged taco seasonings. Relishes. Regular salad dressings. Other foods Salted popcorn and pretzels. The items listed above may not be a complete list of foods and beverages you should avoid. Contact a dietitian for more information. Where to find more information National Heart, Lung, and Blood Institute: https://wilson-eaton.com/ American Heart Association: www.heart.org Academy of Nutrition and Dietetics: www.eatright.[place]: www.kidney.org Summary The DASH eating plan is a healthy eating plan that has been shown to reduce high blood pressure (hypertension). It may also reduce your risk for type 2 diabetes, heart disease, and stroke. When on  the DASH eating plan, aim to eat more fresh fruits and vegetables, whole grains, lean proteins, low-fat dairy, and heart-healthy fats. With the DASH eating plan, you should limit salt (sodium) intake to 2,300 mg a day. If you have hypertension, you may need to reduce your sodium intake to 1,500 mg a  day. Work with your health care provider or dietitian to adjust your eating plan to your individual calorie needs. This information is not intended to replace advice given to you by your health care provider. Make sure you discuss any questions you have with your health care provider. Document Revised: 07/09/2019 Document Reviewed: 07/09/2019 Elsevier Patient Education  [place].

## 2022-09-27 NOTE — Progress Notes (Signed)
Provider: Marlowe Sax FNP-C   [place] Zapien, Nelda Bucks, NP  Patient Care Team: Anuhea Gassner, Nelda Bucks, NP as PCP - General (Family Medicine) Valentina Shaggy, MD as Consulting Physician (Allergy and Immunology)  Extended Emergency Contact Information Primary Emergency Contact: DeVorce,Mabel          [place], [place] Mobile Phone: [phone] Relation: Sister  Code Status:  Full Code  Goals of care: Advanced Directive information    09/27/2022    9:09 AM  Advanced Directives  Does Patient Have a Medical Advance Directive? No  Would patient like information on creating a medical advance directive? No - Patient declined     Chief Complaint  Patient presents with   Annual Exam    Physical Visit   Quality Metric Gaps    Needs to discuss Mammogram,TDAP,Shingrix, Influenza, Covid,Pneumonia Vaccine & Dexa Scan      HPI:  Pt is a 67 y.o. female seen today for Annual physical examination and medical management of chronic diseases.   States has been doing well except work stress. Has modified her diet avoiding salty and fried food. Plans to start a class at her church on plant based food and exercise. Has lost 2 lbs since last visit though states according to her scale at home she has lost 10 lbs.   Due for screening mammography and bone density. Also due for Tdap vaccine  States completed shingles vaccine though one is noted on chart.will verify with the pharmacy then update 2nd dose.   Past Medical History:  Diagnosis Date   Asthma    Ovarian cancer [facility])    Past Surgical History:  Procedure Laterality Date   BREAST LUMPECTOMY     COLONOSCOPY  2021   Dr. Juleen China, per new patient form   DIAGNOSTIC MAMMOGRAM  2021   Dr. Raul Del, Per new patient forms   PARTIAL HYSTERECTOMY  1999   Dr. Garwin Brothers    Allergies  Allergen Reactions   Shellfish Allergy Anaphylaxis    Itching/swelling   Benadryl [Diphenhydramine] Hives   Other Cough    Strong  Prefumes/Cologne.     Allergies as of 09/27/2022       Reactions   Shellfish Allergy Anaphylaxis   Itching/swelling   Benadryl [diphenhydramine] Hives   Other Cough   Strong Prefumes/Cologne.         Medication List        Accurate as of September 27, 2022  9:14 AM. If you have any questions, ask your nurse or doctor.          STOP taking these medications    albuterol 108 (90 Base) MCG/ACT inhaler Commonly known as: Ventolin HFA Stopped by: Sandrea Hughs, NP   Pulmicort Flexhaler 180 MCG/ACT inhaler Generic drug: budesonide Stopped by: Sandrea Hughs, NP       TAKE these medications    Arnuity Ellipta 200 MCG/ACT Aepb Generic drug: Fluticasone Furoate Take 1 puff by mouth daily.   Fish Oil 1000 MG Caps Take 2 capsules by mouth daily.   Hibiclens 4 % external liquid Generic drug: chlorhexidine Apply topically daily as needed.   loratadine 10 MG tablet Commonly known as: CLARITIN TAKE 1 TABLET BY MOUTH 1 OR 2 TIMES DAILY   montelukast 10 MG tablet Commonly known as: SINGULAIR Take 1 tablet (10 mg total) by mouth at bedtime.   OVER THE COUNTER MEDICATION Apply 1 application topically daily. [employer], [employer], and Essential [employer].  Paxlovid (300/100) 20 x 150 MG & 10 x 100MG Tbpk Generic drug: nirmatrelvir & ritonavir Take 1 tablet by mouth See admin instructions. Take as directed        Review of Systems  Constitutional:  Negative for appetite change, chills, fatigue, fever and unexpected weight change.  HENT:  Negative for congestion, dental problem, ear discharge, ear pain, facial swelling, hearing loss, nosebleeds, postnasal drip, rhinorrhea, sinus pressure, sinus pain, sneezing, sore throat, tinnitus and trouble swallowing.   Eyes:  Negative for pain, discharge, redness, itching and visual disturbance.  Respiratory:  Negative for cough, chest tightness, shortness of breath and wheezing.   Cardiovascular:   Negative for chest pain, palpitations and leg swelling.  Gastrointestinal:  Negative for abdominal distention, abdominal pain, blood in stool, constipation, diarrhea, nausea and vomiting.  Endocrine: Negative for cold intolerance, heat intolerance, polydipsia, polyphagia and polyuria.  Genitourinary:  Negative for difficulty urinating, dysuria, flank pain, frequency and urgency.  Musculoskeletal:  Negative for arthralgias, back pain, gait problem, joint swelling, myalgias, neck pain and neck stiffness.  Skin:  Negative for color change, pallor, rash and wound.  Neurological:  Negative for dizziness, syncope, speech difficulty, weakness, light-headedness, numbness and headaches.  Hematological:  Does not bruise/bleed easily.  Psychiatric/Behavioral:  Negative for agitation, behavioral problems, confusion, hallucinations, self-injury, sleep disturbance and suicidal ideas. The patient is not nervous/anxious.     Immunization History  Administered Date(s) Administered   Influenza Inj Mdck Quad Pf 08/29/2018   Influenza, High Dose Seasonal PF 04/27/2022   Influenza, Quadrivalent, Recombinant, Inj, Pf 06/05/2019   Influenza,inj,Quad PF,6+ Mos 06/07/2020   Influenza-Unspecified 07/01/2021   PFIZER(Purple Top)SARS-COV-2 Vaccination 09/11/2019, 10/02/2019, 07/01/2020, 12/16/2020, 05/24/2021   Tetanus 09/18/1998   Unspecified SARS-COV-2 Vaccination 05/18/2022   Zoster Recombinat (Shingrix) 01/12/2022   Pertinent  Health Maintenance Due  Topic Date Due   MAMMOGRAM  10/19/2021   DEXA SCAN  Never done   INFLUENZA VACCINE  Completed   PAP SMEAR-Modifier  Discontinued   COLONOSCOPY (Pts 45-67yr Insurance coverage will need to be confirmed)  Discontinued      07/24/2019    9:24 AM 04/12/2021    6:57 PM 09/21/2021    9:03 AM 04/08/2022   10:54 AM 09/27/2022    9:00 AM  Fall Risk  Falls in the past year?   0  0  Was there an injury with Fall?   0  0  Fall Risk Category Calculator   0  0  Fall Risk  Category (Retired)   Low    (RETIRED) Patient Fall Risk Level Low fall risk Low fall risk Low fall risk Low fall risk   Patient at Risk for Falls Due to   No Fall Risks  No Fall Risks  Fall risk Follow up   Falls evaluation completed  Falls evaluation completed   Functional Status Survey:    Vitals:   09/27/22 0904  BP: 127/88  Pulse: 72  Resp: 18  Temp: 98.1 F (36.7 C)  SpO2: 100%  Weight: 180 lb 6 oz (81.8 kg)  Height: 5' 7"$  (1.702 m)   Body mass index is 28.25 kg/m. Physical Exam Vitals reviewed.  Constitutional:      General: She is not in acute distress.    Appearance: Normal appearance. She is normal weight. She is not ill-appearing or diaphoretic.  HENT:     Head: Normocephalic.     Right Ear: Tympanic membrane, ear canal and external ear normal. There is no impacted cerumen.  Left Ear: Tympanic membrane, ear canal and external ear normal. There is no impacted cerumen.     Nose: Nose normal. No congestion or rhinorrhea.     Mouth/Throat:     Mouth: Mucous membranes are moist.     Pharynx: Oropharynx is clear. No oropharyngeal exudate or posterior oropharyngeal erythema.  Eyes:     General: No scleral icterus.       Right eye: No discharge.        Left eye: No discharge.     Extraocular Movements: Extraocular movements intact.     Conjunctiva/sclera: Conjunctivae normal.     Pupils: Pupils are equal, round, and reactive to light.  Neck:     Vascular: No carotid bruit.  Cardiovascular:     Rate and Rhythm: Normal rate and regular rhythm.     Pulses: Normal pulses.     Heart sounds: Normal heart sounds. No murmur heard.    No friction rub. No gallop.  Pulmonary:     Effort: Pulmonary effort is normal. No respiratory distress.     Breath sounds: Normal breath sounds. No wheezing, rhonchi or rales.  Chest:     Chest wall: No tenderness.  Abdominal:     General: Bowel sounds are normal. There is no distension.     Palpations: Abdomen is soft. There is no  mass.     Tenderness: There is no abdominal tenderness. There is no right CVA tenderness, left CVA tenderness, guarding or rebound.  Musculoskeletal:        General: No swelling or tenderness. Normal range of motion.     Cervical back: Normal range of motion. No rigidity or tenderness.     Right lower leg: No edema.     Left lower leg: No edema.  Lymphadenopathy:     Cervical: No cervical adenopathy.  Skin:    General: Skin is warm and dry.     Coloration: Skin is not pale.     Findings: No bruising, erythema, lesion or rash.  Neurological:     Mental Status: She is alert and oriented to person, place, and time.     Cranial Nerves: No cranial nerve deficit.     Sensory: No sensory deficit.     Motor: No weakness.     Coordination: Coordination normal.     Gait: Gait normal.  Psychiatric:        Mood and Affect: Mood normal.        Speech: Speech normal.        Behavior: Behavior normal.        Thought Content: Thought content normal.        Judgment: Judgment normal.     Labs reviewed: Recent Labs    02/04/22 0939  NA 143  K 3.8  CL 108  CO2 27  GLUCOSE 104*  BUN 7  CREATININE 0.83  CALCIUM 9.1   Recent Labs    02/04/22 0939  AST 15  ALT 12  BILITOT 0.6  PROT 6.7   No results for input(s): "WBC", "NEUTROABS", "HGB", "HCT", "MCV", "PLT" in the last 8760 hours. Lab Results  Component Value Date   TSH 2.230 08/25/2019   Lab Results  Component Value Date   HGBA1C 5.9 (H) 02/04/2022   Lab Results  Component Value Date   CHOL 250 (H) 06/28/2022   HDL 64 06/28/2022   LDLCALC 170 (H) 06/28/2022   TRIG 68 06/28/2022   CHOLHDL 3.9 06/28/2022    Significant Diagnostic Results in  last 30 days:  No results found.  Assessment/Plan 1. Annual physical exam -Available records reviewed, immunization up-to-date except due for pneumococcal, COVID-19, influenza, and Tdap vaccine.  Per patient has completed shingles vaccine but only one is visible on chart.will  obtain record from pharmacy then update chart.  Also due for screening mammography and bone density agrees for orders to be placed at this visit.  2. Hyperlipidemia LDL goal <100 LDL on chart not at goal Dietary modification and exercise at least 3 times per week for 30 minutes advised. Continue on omega-3 fatty acids.  Will recheck lipids then start on statin if indicated. - Lipid panel  3. Moderate persistent asthma without complication Symptoms stable Continue on Singulair, Ellipta and albuterol - COMPLETE METABOLIC PANEL WITH GFR - CBC with Differential/Platelet  4. Breast cancer screening by mammogram Asymptomatic - MM Digital Screening  5. Postmenopausal estrogen deficiency Reports no fall or fractures.  Not on any calcium supplement or vitamin D will check bone density start on supplement. - DG Bone Density - TSH  6. Prediabetes Lab Results  Component Value Date   HGBA1C 5.9 (H) 02/04/2022  Dietary modification and exercise as above - Hemoglobin A1c  7. Overweight with body mass index (BMI) 25.0-29.9 BMI 28.25 with associated comorbidities asthma, hyperlipidemia and prediabetes.  Dietary modification and exercise as above.  Family/ staff Communication: Reviewed plan of care with patient verbalized understanding  Labs/tests ordered: - COMPLETE METABOLIC PANEL WITH GFR - CBC with Differential/Platelet - Hemoglobin A1c - Lipid panel - TSH - DG Bone Density - MM Digital Screening  Next Appointment : Return in about 1 year (around 09/28/2023) for annual Physical examination.   Sandrea Hughs, NP

## 2022-09-28 LAB — CBC WITH DIFFERENTIAL/PLATELET
Absolute Monocytes: 224 cells/uL (ref 200–950)
Basophils Absolute: 28 cells/uL (ref 0–200)
Basophils Relative: 0.5 %
Eosinophils Absolute: 101 cells/uL (ref 15–500)
Eosinophils Relative: 1.8 %
HCT: 41.5 % (ref 35.0–45.0)
Hemoglobin: 13.9 g/dL (ref 11.7–15.5)
Lymphs Abs: 1876 cells/uL (ref 850–3900)
MCH: 28.8 pg (ref 27.0–33.0)
MCHC: 33.5 g/dL (ref 32.0–36.0)
MCV: 86.1 fL (ref 80.0–100.0)
MPV: 11 fL (ref 7.5–12.5)
Monocytes Relative: 4 %
Neutro Abs: 3371 cells/uL (ref 1500–7800)
Neutrophils Relative %: 60.2 %
Platelets: 213 10*3/uL (ref 140–400)
RBC: 4.82 10*6/uL (ref 3.80–5.10)
RDW: 12.4 % (ref 11.0–15.0)
Total Lymphocyte: 33.5 %
WBC: 5.6 10*3/uL (ref 3.8–10.8)

## 2022-09-28 LAB — LIPID PANEL
Cholesterol: 253 mg/dL — ABNORMAL HIGH (ref <200)
HDL: 66 mg/dL (ref >=50)
LDL Cholesterol (Calc): 172 mg/dL (calc) — ABNORMAL HIGH
Non-HDL Cholesterol (Calc): 187 mg/dL (calc) — ABNORMAL HIGH (ref <130)
Total CHOL/HDL Ratio: 3.8 (calc) (ref <5.0)
Triglycerides: 53 mg/dL (ref <150)

## 2022-09-28 LAB — COMPLETE METABOLIC PANEL WITH GFR
AG Ratio: 1.8 (calc) (ref 1.0–2.5)
ALT: 10 U/L (ref 6–29)
AST: 13 U/L (ref 10–35)
Albumin: 4.5 g/dL (ref 3.6–5.1)
Alkaline phosphatase (APISO): 86 U/L (ref 37–153)
BUN: 9 mg/dL (ref 7–25)
CO2: 25 mmol/L (ref 20–32)
Calcium: 9.3 mg/dL (ref 8.6–10.4)
Chloride: 105 mmol/L (ref 98–110)
Creat: 0.81 mg/dL (ref 0.50–1.05)
Globulin: 2.5 g/dL (calc) (ref 1.9–3.7)
Glucose, Bld: 91 mg/dL (ref 65–99)
Potassium: 4.1 mmol/L (ref 3.5–5.3)
Sodium: 143 mmol/L (ref 135–146)
Total Bilirubin: 0.3 mg/dL (ref 0.2–1.2)
Total Protein: 7 g/dL (ref 6.1–8.1)
eGFR: 81 mL/min/{1.73_m2} (ref >=60)

## 2022-09-28 LAB — HEMOGLOBIN A1C
Hgb A1c MFr Bld: 6.1 % of total Hgb — ABNORMAL HIGH (ref <5.7)
Mean Plasma Glucose: 128 mg/dL
eAG (mmol/L): 7.1 mmol/L

## 2022-09-28 LAB — TSH: TSH: 1.88 mIU/L (ref 0.40–4.50)

## 2022-10-01 ENCOUNTER — Encounter: Payer: Self-pay | Admitting: Allergy & Immunology

## 2022-10-01 ENCOUNTER — Other Ambulatory Visit: Payer: Self-pay

## 2022-10-01 ENCOUNTER — Ambulatory Visit: Payer: 59 | Admitting: Allergy & Immunology

## 2022-10-01 VITALS — BP 132/80 | HR 87 | Temp 97.6°F | Resp 20 | Ht 67.0 in | Wt 181.0 lb

## 2022-10-01 DIAGNOSIS — J310 Chronic rhinitis: Secondary | ICD-10-CM | POA: Diagnosis not present

## 2022-10-01 DIAGNOSIS — J4540 Moderate persistent asthma, uncomplicated: Secondary | ICD-10-CM

## 2022-10-01 NOTE — Progress Notes (Unsigned)
FOLLOW UP  Date of Service/Encounter:  10/01/22   Assessment:   Moderate persistent asthma, uncomplicated   Non-allergic rhinitis    Shellfish allergy   [employer] filet o-fish lover   Lover of westerns  [employer] maker  Plan/Recommendations:   1. Cough - Lung testing looks AMAZING!  - Daily controller medication(s): Arnuity one puff once daily + Singulair 57m  - Rescue medications: albuterol 4 puffs every 4-6 hours as needed  - Asthma control goals:  * Full participation in all desired activities (may need albuterol before activity) * Albuterol use two time or less a week on average (not counting use with activity) * Cough interfering with sleep two time or less a month * Oral steroids no more than once a year * No hospitalizations  2. Chronic non- rhinitis - Continue with the Singulair 178mdaily. - Continue with Claritin twice daily.  3. Return in about 6 months (around 04/01/2023).    Subjective:   Rhonda Kirk a [age].o. female presenting today for follow up of  Chief Complaint  Patient presents with   Follow-up    Pt states she is doing well so far.    Rhonda Kirk a history of the following: Patient Active Problem List   Diagnosis Date Noted   Moderate persistent asthma, uncomplicated 0299991111 BMI 29.0-29.9,adult 09/21/2021   Overweight with body mass index (BMI) 25.0-29.9 09/21/2021   Hyperlipidemia LDL goal <100 09/21/2021   History of ovarian cancer 09/21/2021   Cough 03/02/2020   Non-allergic rhinitis 03/02/2020    History obtained from: chart review and patient.  Rhonda Kirk a [age].o. female presenting for a follow up visit.  She was last seen in August 2023.  At that time, lung testing was not done.  We did Pulmicort 160 mcg 2 puffs twice daily and Singulair 10 mg daily.  For her nonallergic rhinitis, we continue with Singulair and Claritin.  Since last visit, she has done well.  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  Asthma/Respiratory Symptom History: She is feeling very good. Breathing  is going well. She is on Arnuity one puff once daily and this seems to be doing the trick.  Currently stopped covering Pulmicort.  She is doing very well with this and she is breathing easier without any coughing at all.  She likes Arnuity much better than Pulmicort.  [name] asthma has been well controlled. She has not required rescue medication, experienced nocturnal awakenings due to lower respiratory symptoms, nor have activities of daily living been limited. She has required no Emergency Department or Urgent Care visits for her asthma. She has required zero courses of systemic steroids for asthma exacerbations since the last visit. ACT score today is 25, indicating excellent asthma symptom control.   Allergic Rhinitis Symptom History: Environmental allergies are under good control with Singulair and Claritin.  She has not needed antibiotics for any sinus infections.  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  [place] this [place] to [place].  Another son lives in [place], so it is more difficult to get up there to see him.  Otherwise, there have been no changes to her past medical history, surgical history, family history, or social history.    Review of Systems  Constitutional:  Negative.  Negative for chills, fever, malaise/fatigue and weight loss.  HENT: Negative.  Negative for congestion, ear discharge, ear pain and sinus pain.   Eyes:  Negative for pain, discharge and redness.  Respiratory:  Negative for cough, sputum production, shortness of breath, wheezing and stridor.   Cardiovascular: Negative.  Negative for chest pain and palpitations.  Gastrointestinal:  Negative for abdominal pain, constipation, diarrhea, heartburn, nausea and  vomiting.  Skin: Negative.  Negative for itching and rash.  Neurological:  Negative for dizziness and headaches.  Endo/Heme/Allergies:  Positive for environmental allergies. Does not bruise/bleed easily.       Objective:   Blood pressure 132/80, pulse 87, temperature 97.6 F (36.4 C), resp. rate 20, height 5' 7"$  (1.702 m), weight 181 lb (82.1 kg), SpO2 97 %. Body mass index is 28.35 kg/m.    Physical Exam Vitals reviewed.  Constitutional:      Appearance: She is well-developed.  HENT:     Head: Normocephalic and atraumatic.     Right Ear: Tympanic membrane, ear canal and external ear normal. No drainage, swelling or tenderness. Tympanic membrane is not injected, scarred, erythematous, retracted or bulging.     Left Ear: Tympanic membrane, ear canal and external ear normal. No drainage, swelling or tenderness. Tympanic membrane is not injected, scarred, erythematous, retracted or bulging.     Nose: No nasal deformity, septal deviation, mucosal edema or rhinorrhea.     Right Turbinates: Enlarged, swollen and pale.     Left Turbinates: Enlarged, swollen and pale.     Right Sinus: No maxillary sinus tenderness or frontal sinus tenderness.     Left Sinus: No maxillary sinus tenderness or frontal sinus tenderness.     Comments: No nasal polyps noted. No septal deviation.     Mouth/Throat:     Lips: Pink.     Mouth: Mucous membranes are moist. Mucous membranes are not pale and not dry.     Pharynx: Uvula midline.  Eyes:     General: Allergic shiner present.        Right eye: No discharge.        Left eye: No discharge.     Conjunctiva/sclera: Conjunctivae normal.     Right eye: Right conjunctiva is not injected. No chemosis.    Left eye: Left conjunctiva is not injected. No chemosis.    Pupils: Pupils are equal, round, and reactive to light.  Cardiovascular:     Rate and Rhythm: Normal rate and regular rhythm.     Heart sounds: Normal heart sounds.  Pulmonary:     Effort:  Pulmonary effort is normal. No tachypnea, accessory muscle usage or respiratory distress.     Breath sounds: Normal breath sounds. No wheezing, rhonchi or rales.     Comments: Coarse upper airway sounds.  Chest:     Chest wall: No tenderness.  Abdominal:     Tenderness: There is no abdominal tenderness. There is no guarding or rebound.  Lymphadenopathy:     Head:     Right side of head: No submandibular, tonsillar or occipital adenopathy.     Left side of head: No submandibular, tonsillar or occipital adenopathy.     Cervical: No cervical adenopathy.  Skin:    General: Skin is warm.     Capillary Refill: Capillary refill takes less than 2 seconds.     Coloration: Skin is not jaundiced or pale.     Findings: No abrasion, erythema, petechiae or rash. Rash is not papular, urticarial or  vesicular.  Neurological:     Mental Status: She is alert.  Psychiatric:        Behavior: Behavior is cooperative.      Diagnostic studies:    Spirometry: results normal (FEV1: 1.93/87%, FVC: 2.21/77%, FEV1/FVC: 87%).    Spirometry consistent with normal pattern.    Allergy Studies: none       Salvatore Marvel, MD  [facility]

## 2022-10-01 NOTE — Patient Instructions (Addendum)
1. Cough - Lung testing looks AMAZING!  - Daily controller medication(s): Arnuity one puff once daily + Singulair 63m  - Rescue medications: albuterol 4 puffs every 4-6 hours as needed  - Asthma control goals:  * Full participation in all desired activities (may need albuterol before activity) * Albuterol use two time or less a week on average (not counting use with activity) * Cough interfering with sleep two time or less a month * Oral steroids no more than once a year * No hospitalizations  2. Chronic non- rhinitis - Continue with the Singulair 155mdaily. - Continue with Claritin twice daily.  3. Return in about 6 months (around 04/01/2023). GO TO [place]!!!   Please inform usKoreaf any Emergency Department visits, hospitalizations, or changes in symptoms. Call usKoreaefore going to the ED for breathing or allergy symptoms since we might be able to fit you in for a sick visit. Feel free to contact usKoreanytime with any questions, problems, or concerns.  It was a pleasure to see you again today!  Websites that have reliable patient information: 1. American Academy of Asthma, Allergy, and Immunology: www.aaaai.org 2. Food Allergy Research and Education (FARE): foodallergy.org 3. Mothers of Asthmatics: http://www.asthmacommunitynetwork.org 4. American College of Allergy, Asthma, and Immunology: www.acaai.org   COVID-19 Vaccine Information can be found at: [email or web address] questions related to vaccine distribution or appointments, please email [email or web address] or call [phone]    "Like" usKorean Facebook and Instagram for our latest updates!       Make sure you are registered to vote! If you have moved or changed any of your contact information, you will need to get this updated before voting!  In some cases, you MAY be able to register to vote online:  [email or web address]

## 2022-10-02 MED ORDER — ALBUTEROL SULFATE HFA 108 (90 BASE) MCG/ACT IN AERS: 2.0000 | INHALATION_SPRAY | RESPIRATORY_TRACT | 1 refills | Status: DC | PRN

## 2022-10-02 MED ORDER — MONTELUKAST SODIUM 10 MG PO TABS: 10.0000 mg | ORAL_TABLET | Freq: Every day | ORAL | 5 refills | Status: DC

## 2022-10-02 MED ORDER — ARNUITY ELLIPTA 200 MCG/ACT IN AEPB: 1.0000 | INHALATION_SPRAY | Freq: Every day | RESPIRATORY_TRACT | 5 refills | Status: DC

## 2022-10-07 DIAGNOSIS — R7303 Prediabetes: Secondary | ICD-10-CM | POA: Insufficient documentation

## 2022-11-22 ENCOUNTER — Ambulatory Visit
Admission: RE | Admit: 2022-11-22 | Discharge: 2022-11-22 | Disposition: A | Discharge status: Home / Self Care | Payer: 59 | Source: Ambulatory Visit | Attending: Family | Admitting: Family

## 2023-03-10 ENCOUNTER — Inpatient Hospital Stay: Admission: RE | Admit: 2023-03-10 | Payer: 59 | Source: Ambulatory Visit

## 2023-03-20 ENCOUNTER — Ambulatory Visit: Payer: 59 | Admitting: Family

## 2023-03-20 VITALS — BP 160/80 | HR 87 | Temp 97.6°F | Resp 18 | Ht 67.0 in | Wt 188.0 lb

## 2023-03-20 DIAGNOSIS — R21 Rash and other nonspecific skin eruption: Secondary | ICD-10-CM

## 2023-03-20 MED ORDER — PREDNISONE 20 MG PO TABS
ORAL_TABLET | ORAL | 0 refills | Status: AC
Start: 2023-03-20 — End: 2023-03-24

## 2023-03-20 MED ORDER — TRIAMCINOLONE ACETONIDE 0.1 % EX CREA: 1.0000 | TOPICAL_CREAM | Freq: Two times a day (BID) | CUTANEOUS | 0 refills | Status: DC

## 2023-03-20 NOTE — Patient Instructions (Addendum)
-   Notify provider if symptoms worsen or fail to improve  °

## 2023-03-20 NOTE — Progress Notes (Signed)
Provider: Richarda Blade FNP-C  Dexter Sauser, Donalee Citrin, NP  Patient Care Team: Trianna Lupien, Donalee Citrin, NP as PCP - General (Family Medicine) Alfonse Spruce, MD as Consulting Physician (Allergy and Immunology)  Extended Emergency Contact Information Primary Emergency Contact: DeVorce,Mabel          [place],  [place] Mobile Phone: [phone] Relation: Sister  Code Status:  Full Code Goals of care: Advanced Directive information    09/27/2022    9:09 AM  Advanced Directives  Does Patient Have a Medical Advance Directive? No  Would patient like information on creating a medical advance directive? No - Patient declined     Chief Complaint  Patient presents with   Acute Visit    Patient is here for rash on inside of left elbow been there for over a month starting on right arm as well    HPI:  Pt is a 67 y.o. female seen today for an acute visit for evaluation of left antecubital rash x 3 days.describes rash as itchy.Has been applying coconut oil plus natural oil made by her sister which has helped but not resolved.Rash seems to be starting on right arm too.she denies any change in lotion or detergent. Also denies any fever or chills.    Past Medical History:  Diagnosis Date   Asthma    Ovarian cancer [facility])    Past Surgical History:  Procedure Laterality Date   BREAST LUMPECTOMY     COLONOSCOPY  2021   Dr. Earlene Plater, per new patient form   DIAGNOSTIC MAMMOGRAM  2021   Dr. Meredeth Ide, Per new patient forms   PARTIAL HYSTERECTOMY  1999   Dr. Cherly Hensen    Allergies  Allergen Reactions   Shellfish Allergy Anaphylaxis    Itching/swelling   Benadryl [Diphenhydramine] Hives   Other Cough    Strong Prefumes/Cologne.     Outpatient Encounter Medications as of 03/20/2023  Medication Sig   albuterol (VENTOLIN HFA) 108 (90 Base) MCG/ACT inhaler Inhale 2 puffs into the lungs every 4 (four) hours as needed for wheezing or shortness of breath.   ARNUITY ELLIPTA 200  MCG/ACT AEPB Take 1 puff by mouth daily.   ARNUITY ELLIPTA 200 MCG/ACT AEPB Inhale 1 puff into the lungs daily.   loratadine (CLARITIN) 10 MG tablet TAKE 1 TABLET BY MOUTH 1 OR 2 TIMES DAILY   montelukast (SINGULAIR) 10 MG tablet Take 1 tablet (10 mg total) by mouth at bedtime.   Omega-3 Fatty Acids (FISH OIL) 1000 MG CAPS Take 2 capsules by mouth daily.   OVER THE COUNTER MEDICATION Apply 1 application topically daily. [employer] Salon [employer], [employer], and Essential Oils.   [DISCONTINUED] chlorhexidine (HIBICLENS) 4 % external liquid Apply topically daily as needed. (Patient not taking: Reported on 10/01/2022)   [DISCONTINUED] nirmatrelvir & ritonavir (PAXLOVID, 300/100,) 20 x 150 MG & 10 x 100MG  TBPK Take 1 tablet by mouth See admin instructions. Take as directed (Patient not taking: Reported on 10/01/2022)   No facility-administered encounter medications on file as of 03/20/2023.    Review of Systems  Constitutional:  Negative for appetite change, chills, fatigue, fever and unexpected weight change.  Eyes:  Negative for pain, discharge, redness, itching and visual disturbance.  Respiratory:  Negative for cough, chest tightness, shortness of breath and wheezing.   Cardiovascular:  Negative for chest pain, palpitations and leg swelling.  Gastrointestinal:  Negative for abdominal distention, abdominal pain, diarrhea, nausea and vomiting.  Musculoskeletal:  Negative for arthralgias,  back pain, gait problem, joint swelling and myalgias.  Skin:  Positive for rash. Negative for color change, pallor and wound.  Neurological:  Negative for dizziness, weakness, light-headedness, numbness and headaches.    Immunization History  Administered Date(s) Administered   Influenza Inj Mdck Quad Pf 08/29/2018   Influenza, High Dose Seasonal PF 04/27/2022   Influenza, Quadrivalent, Recombinant, Inj, Pf 06/05/2019   Influenza,inj,Quad PF,6+ Mos 06/07/2020   Influenza-Unspecified 07/01/2021    PFIZER(Purple Top)SARS-COV-2 Vaccination 09/11/2019, 10/02/2019, 07/01/2020, 12/16/2020, 05/24/2021   PNEUMOCOCCAL CONJUGATE-20 09/27/2022   Tetanus 09/18/1998   Unspecified SARS-COV-2 Vaccination 05/18/2022   Zoster Recombinant(Shingrix) 01/12/2022   Pertinent  Health Maintenance Due  Topic Date Due   DEXA SCAN  Never done   INFLUENZA VACCINE  03/20/2023   MAMMOGRAM  11/21/2024   PAP SMEAR-Modifier  Discontinued   Colonoscopy  Discontinued      07/24/2019    9:24 AM 04/12/2021    6:57 PM 09/21/2021    9:03 AM 04/08/2022   10:54 AM 09/27/2022    9:00 AM  Fall Risk  Falls in the past year?   0  0  Was there an injury with Fall?   0  0  Fall Risk Category Calculator   0  0  Fall Risk Category (Retired)   Low    (RETIRED) Patient Fall Risk Level Low fall risk Low fall risk Low fall risk Low fall risk   Patient at Risk for Falls Due to   No Fall Risks  No Fall Risks  Fall risk Follow up   Falls evaluation completed  Falls evaluation completed   Functional Status Survey:    Vitals:   03/20/23 1528 03/20/23 1535  BP: (!) 160/80 (!) 160/80  Pulse:  87  Resp:  18  Temp:  97.6 F (36.4 C)  SpO2:  96%  Weight:  188 lb (85.3 kg)  Height: 5\' 7"  (1.702 m) 5\' 7"  (1.702 m)   Body mass index is 29.44 kg/m. Physical Exam Vitals reviewed.  Constitutional:      General: She is not in acute distress.    Appearance: Normal appearance. She is overweight. She is not ill-appearing or diaphoretic.  HENT:     Head: Normocephalic.  Cardiovascular:     Rate and Rhythm: Normal rate and regular rhythm.     Pulses: Normal pulses.     Heart sounds: Normal heart sounds. No murmur heard.    No friction rub. No gallop.  Pulmonary:     Effort: Pulmonary effort is normal. No respiratory distress.     Breath sounds: Normal breath sounds. No wheezing, rhonchi or rales.  Chest:     Chest wall: No tenderness.  Musculoskeletal:        General: No swelling or tenderness. Normal range of motion.      Cervical back: Normal range of motion. No rigidity or tenderness.     Right lower leg: No edema.     Left lower leg: No edema.  Lymphadenopathy:     Cervical: No cervical adenopathy.  Skin:    General: Skin is warm and dry.     Coloration: Skin is not pale.     Findings: Rash present. No bruising, erythema or lesion.     Comments: Left antecubital raised rash with diffused redness noted.Not warm to touch.   Neurological:     Mental Status: She is alert and oriented to person, place, and time.     Motor: No weakness.  Gait: Gait normal.  Psychiatric:        Mood and Affect: Mood normal.        Speech: Speech normal.        Behavior: Behavior normal.     Labs reviewed: Recent Labs    09/27/22 0950  NA 143  K 4.1  CL 105  CO2 25  GLUCOSE 91  BUN 9  CREATININE 0.81  CALCIUM 9.3   Recent Labs    09/27/22 0950  AST 13  ALT 10  BILITOT 0.3  PROT 7.0   Recent Labs    09/27/22 0950  WBC 5.6  NEUTROABS 3,371  HGB 13.9  HCT 41.5  MCV 86.1  PLT 213   Lab Results  Component Value Date   TSH 1.88 09/27/2022   Lab Results  Component Value Date   HGBA1C 6.1 (H) 09/27/2022   Lab Results  Component Value Date   CHOL 253 (H) 09/27/2022   HDL 66 09/27/2022   LDLCALC 172 (H) 09/27/2022   TRIG 53 09/27/2022   CHOLHDL 3.8 09/27/2022    Significant Diagnostic Results in last 30 days:  No results found.  Assessment/Plan  Rash and nonspecific skin eruption Afebrile  Unclear etiology  - start on tapered prednisone as below.side effectives discussed  - predniSONE (DELTASONE) 20 MG tablet; Take 2 tablets (40 mg total) by mouth daily with breakfast for 1 day, THEN 1.5 tablets (30 mg total) daily with breakfast for 1 day, THEN 1 tablet (20 mg total) daily with breakfast for 1 day, THEN 0.5 tablets (10 mg total) daily with breakfast for 1 day.  Dispense: 5 tablet; Refill: 0 - triamcinolone cream (KENALOG) 0.1 %; Apply 1 Application topically 2 (two) times daily. On  left arm  Dispense: 30 g; Refill: 0  Family/ staff Communication: Reviewed plan of care with patient verbalized understanding   Labs/tests ordered: None   Next Appointment: Return if symptoms worsen or fail to improve.   Caesar Bookman, NP

## 2023-04-01 ENCOUNTER — Ambulatory Visit: Payer: 59 | Admitting: Allergy & Immunology

## 2023-04-01 ENCOUNTER — Other Ambulatory Visit: Payer: Self-pay

## 2023-04-01 ENCOUNTER — Encounter: Payer: Self-pay | Admitting: Allergy & Immunology

## 2023-04-01 VITALS — BP 130/88 | HR 70 | Temp 97.3°F | Ht 67.0 in | Wt 187.2 lb

## 2023-04-01 DIAGNOSIS — J4540 Moderate persistent asthma, uncomplicated: Secondary | ICD-10-CM

## 2023-04-01 DIAGNOSIS — R21 Rash and other nonspecific skin eruption: Secondary | ICD-10-CM | POA: Diagnosis not present

## 2023-04-01 DIAGNOSIS — J310 Chronic rhinitis: Secondary | ICD-10-CM

## 2023-04-01 MED ORDER — MONTELUKAST SODIUM 10 MG PO TABS: 10.0000 mg | ORAL_TABLET | Freq: Every day | ORAL | 3 refills | Status: DC

## 2023-04-01 MED ORDER — LORATADINE 10 MG PO TABS: ORAL_TABLET | ORAL | 3 refills | Status: DC

## 2023-04-01 MED ORDER — ARNUITY ELLIPTA 200 MCG/ACT IN AEPB: 1.0000 | INHALATION_SPRAY | Freq: Every day | RESPIRATORY_TRACT | 11 refills | Status: DC

## 2023-04-01 NOTE — Progress Notes (Signed)
FOLLOW UP  Date of Service/Encounter:  04/01/23   Assessment:   Moderate persistent asthma, uncomplicated   Non-allergic rhinitis   Rash - improving with prednisone and triamcinolone (unknown trigger, possibly stress)    Shellfish allergy   [employer] filet o-fish lover   Lover of westerns   [employer] maker  Plan/Recommendations:   1. Cough - Lung testing looks AMAZING!  - Daily controller medication(s): Arnuity one puff once daily + Singulair 10mg   - Rescue medications: albuterol 4 puffs every 4-6 hours as needed  - Asthma control goals:  * Full participation in all desired activities (may need albuterol before activity) * Albuterol use two time or less a week on average (not counting use with activity) * Cough interfering with sleep two time or less a month * Oral steroids no more than once a year * No hospitalizations  2. Chronic non- rhinitis - Continue with the Singulair 10mg  daily. - Continue with Claritin twice daily.  3. Rash - Take pictures if this rash happens again. - Call us if you need refills of this.   4. Return in about 6 months (around 10/02/2023).    Subjective:   Rhonda Kirk is a 67 y.o. female presenting today for follow up of  Chief Complaint  Patient presents with   Follow-up    C/o of having a red rash on left arm itchy and dry ointment to prevent blistering x 1 month    Kristin Massar has a history of the following: Patient Active Problem List   Diagnosis Date Noted   Prediabetes 10/07/2022   Moderate persistent asthma, uncomplicated 09/21/2021   BMI 29.0-29.9,adult 09/21/2021   Overweight with body mass index (BMI) 25.0-29.9 09/21/2021   Hyperlipidemia LDL goal <100 09/21/2021   History of ovarian cancer 09/21/2021   Cough 03/02/2020   Non-allergic rhinitis 03/02/2020    History obtained from: chart review and patient.  Rhonda Kirk is a 67 y.o. female presenting for a follow up visit.  She was last seen in February 2024.  At  that time, her lung testing looked amazing. We continued with Arnuity 1 puff once daily as well as Singulair 10 mg daily and albuterol as needed.  Rhinitis was under good control with Singulair and Claritin.  Since last visit, she has largely done well.   Asthma/Respiratory Symptom History: Breathing is under fair control. She has been out of Arnuity for 3 days and she has been using her albuterol inhaler in the morningts since she ran out of it.  Allergic Rhinitis Symptom History: She has been doing well with her allergic rhinitis. She has not had any sinus infections at all.   Skin Symptom History: She had a stress induced rash that started a few weeks ago, likely around one month. She thought that she could get rid of it herself, but she ended up getting prednisone for four days and a topical steroid. It was very pruritic. There was one on the other arm that cleared up with the prednisone and then iut started on the other arm. This was only a one time event. There were no  changes at all to her exposures.   Work is stressful. 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 This was all due to the [employer].   She is going to be going to [employer].   Otherwise, there have been no changes to her past medical history, surgical history, family history, or social history.  Review of systems otherwise negative other than that mentioned in the HPI.    Objective:   Blood pressure 130/88, pulse 70, temperature (!) 97.3 F (36.3 C), height 5\' 7"  (1.702 m), weight 187 lb 3.2 oz (84.9 kg), SpO2 98%. Body mass index is 29.32 kg/m.    Physical Exam Vitals reviewed.  Constitutional:      Appearance: She is well-developed.  HENT:     Head: Normocephalic and atraumatic.     Right Ear: Tympanic membrane, ear canal and external ear normal. No drainage, swelling or tenderness. Tympanic membrane is not injected, scarred, erythematous, retracted or  bulging.     Left Ear: Tympanic membrane, ear canal and external ear normal. No drainage, swelling or tenderness. Tympanic membrane is not injected, scarred, erythematous, retracted or bulging.     Nose: No nasal deformity, septal deviation, mucosal edema or rhinorrhea.     Right Turbinates: Enlarged, swollen and pale.     Left Turbinates: Enlarged, swollen and pale.     Right Sinus: No maxillary sinus tenderness or frontal sinus tenderness.     Left Sinus: No maxillary sinus tenderness or frontal sinus tenderness.     Comments: No nasal polyps noted. No septal deviation.     Mouth/Throat:     Lips: Pink.     Mouth: Mucous membranes are moist. Mucous membranes are not pale and not dry.     Pharynx: Uvula midline.  Eyes:     General: Allergic shiner present.        Right eye: No discharge.        Left eye: No discharge.     Conjunctiva/sclera: Conjunctivae normal.     Right eye: Right conjunctiva is not injected. No chemosis.    Left eye: Left conjunctiva is not injected. No chemosis.    Pupils: Pupils are equal, round, and reactive to light.  Cardiovascular:     Rate and Rhythm: Normal rate and regular rhythm.     Heart sounds: Normal heart sounds.  Pulmonary:     Effort: Pulmonary effort is normal. No tachypnea, accessory muscle usage or respiratory distress.     Breath sounds: Normal breath sounds. No wheezing, rhonchi or rales.     Comments: Coarse upper airway sounds.  Chest:     Chest wall: No tenderness.  Abdominal:     Tenderness: There is no abdominal tenderness. There is no guarding or rebound.  Lymphadenopathy:     Head:     Right side of head: No submandibular, tonsillar or occipital adenopathy.     Left side of head: No submandibular, tonsillar or occipital adenopathy.     Cervical: No cervical adenopathy.  Skin:    General: Skin is warm.     Capillary Refill: Capillary refill takes less than 2 seconds.     Coloration: Skin is not jaundiced or pale.     Findings:  No abrasion, erythema, petechiae or rash. Rash is not papular, urticarial or vesicular.     Comments: Confluent, hyperpigmented lesion approximately 6 inches in diameter around the left antecubital fossa.   Neurological:     Mental Status: She is alert.  Psychiatric:        Behavior: Behavior is cooperative.      Diagnostic studies:    Spirometry: results normal (FEV1: 1.80/81%, FVC: 2.28/81%, FEV1/FVC: 79%).    Spirometry consistent with normal pattern.    Allergy Studies: none        Malachi Bonds, MD  [facility]

## 2023-04-01 NOTE — Patient Instructions (Addendum)
1. Cough - Lung testing looks AMAZING!  - Daily controller medication(s): Arnuity one puff once daily + Singulair 10mg   - Rescue medications: albuterol 4 puffs every 4-6 hours as needed  - Asthma control goals:  * Full participation in all desired activities (may need albuterol before activity) * Albuterol use two time or less a week on average (not counting use with activity) * Cough interfering with sleep two time or less a month * Oral steroids no more than once a year * No hospitalizations  2. Chronic non- rhinitis - Continue with the Singulair 10mg  daily. - Continue with Claritin twice daily.  3. Rash - Take pictures if this rash happens again. - Call us if you need refills of this.   4. Return in about 6 months (around 10/02/2023).    Please inform us of any Emergency Department visits, hospitalizations, or changes in symptoms. Call us before going to the ED for breathing or allergy symptoms since we might be able to fit you in for a sick visit. Feel free to contact us anytime with any questions, problems, or concerns.  It was a pleasure to see you again today!  Websites that have reliable patient information: 1. American Academy of Asthma, Allergy, and Immunology: www.aaaai.org 2. Food Allergy Research and Education (FARE): foodallergy.org 3. Mothers of Asthmatics: http://www.asthmacommunitynetwork.org 4. American College of Allergy, Asthma, and Immunology: www.acaai.org   COVID-19 Vaccine Information can be found at: [email or web address] For questions related to vaccine distribution or appointments, please email [email or web address] or call [phone].     "Like" [place] on Facebook and Instagram for our latest updates!       Make sure you are registered to vote! If you have moved or changed any of your contact information, you will need to get this updated before voting!  In some cases, you MAY be able to  register to vote online: [email or web address]

## 2023-04-19 ENCOUNTER — Other Ambulatory Visit: Payer: Self-pay | Admitting: Family

## 2023-04-19 DIAGNOSIS — R21 Rash and other nonspecific skin eruption: Secondary | ICD-10-CM

## 2023-04-22 NOTE — Telephone Encounter (Signed)
Patient has request refill on pended cream.

## 2023-10-02 ENCOUNTER — Other Ambulatory Visit: Payer: Self-pay

## 2023-10-02 ENCOUNTER — Ambulatory Visit: Payer: 59 | Admitting: Allergy & Immunology

## 2023-10-02 ENCOUNTER — Encounter: Payer: Self-pay | Admitting: Allergy & Immunology

## 2023-10-02 VITALS — BP 158/86 | HR 65 | Temp 98.3°F | Resp 16

## 2023-10-02 DIAGNOSIS — J4540 Moderate persistent asthma, uncomplicated: Secondary | ICD-10-CM | POA: Diagnosis not present

## 2023-10-02 DIAGNOSIS — T7802XD Anaphylactic reaction due to shellfish (crustaceans), subsequent encounter: Secondary | ICD-10-CM | POA: Diagnosis not present

## 2023-10-02 DIAGNOSIS — J310 Chronic rhinitis: Secondary | ICD-10-CM

## 2023-10-02 DIAGNOSIS — R21 Rash and other nonspecific skin eruption: Secondary | ICD-10-CM

## 2023-10-02 MED ORDER — ALBUTEROL SULFATE HFA 108 (90 BASE) MCG/ACT IN AERS: 2.0000 | INHALATION_SPRAY | RESPIRATORY_TRACT | 1 refills | Status: DC | PRN

## 2023-10-02 MED ORDER — MONTELUKAST SODIUM 10 MG PO TABS: 10.0000 mg | ORAL_TABLET | Freq: Every day | ORAL | 5 refills | Status: DC

## 2023-10-02 MED ORDER — LORATADINE 10 MG PO TABS: ORAL_TABLET | ORAL | 5 refills | Status: DC

## 2023-10-02 MED ORDER — ARNUITY ELLIPTA 200 MCG/ACT IN AEPB: 1.0000 | INHALATION_SPRAY | Freq: Every day | RESPIRATORY_TRACT | 5 refills | Status: AC

## 2023-10-02 NOTE — Progress Notes (Signed)
FOLLOW UP  Date of Service/Encounter:  10/02/23   Assessment:   Moderate persistent asthma, uncomplicated   Non-allergic rhinitis    Rash - improving with prednisone and triamcinolone (unknown trigger, possibly stress)    Shellfish allergy   [employer] filet-o-fish lover   Lover of westerns   [employer] maker  Plan/Recommendations:   1. Cough - Lung testing looks AMAZING!  - Let us know if the inhalers become too expensive. - Daily controller medication(s): Arnuity one puff once daily + Singulair 10mg   - Rescue medications: albuterol 4 puffs every 4-6 hours as needed  - Asthma control goals:  * Full participation in all desired activities (may need albuterol before activity) * Albuterol use two time or less a week on average (not counting use with activity) * Cough interfering with sleep two time or less a month * Oral steroids no more than once a year * No hospitalizations  2. Chronic non- rhinitis - Continue with the Singulair 10mg  daily. - Continue with Claritin twice daily.  3. Rash - Continue with the vitamin E oil.   4. Return in about 6 months (around 03/31/2024). You can have the follow up appointment with Dr. Dellis Anes or a Nurse Practicioner (our Nurse Practitioners are excellent and always have Physician oversight!).  Subjective:   Rhonda Kirk is a 68 y.o. female presenting today for follow up of  Chief Complaint  Patient presents with   Follow-up    Cough during colder weather   Asthma    Rhonda Kirk has a history of the following: Patient Active Problem List   Diagnosis Date Noted   Prediabetes 10/07/2022   Moderate persistent asthma, uncomplicated 09/21/2021   BMI 29.0-29.9,adult 09/21/2021   Overweight with body mass index (BMI) 25.0-29.9 09/21/2021   Hyperlipidemia LDL goal <100 09/21/2021   History of ovarian cancer 09/21/2021   Cough 03/02/2020   Non-allergic rhinitis 03/02/2020    History obtained from: chart review and  patient.  Discussed the use of AI scribe software for clinical note transcription with the patient and/or guardian, who gave verbal consent to proceed.  Rhonda Kirk is a 68 y.o. female presenting for a follow up visit.  She was last seen in August 2024.  At that time, we continue with Arnuity 1 puff once daily as well as Singulair 10 mg daily.  She also had albuterol to use during flares.  For her right nonallergic rhinitis, we continue with Singulair as well as Claritin twice daily.  We recommended that she take pictures of the rash that she was experiencing.  Since last visit, she has done very well.  She is looking into retirement by the end of the year.  Asthma/Respiratory Symptom History: She has been experiencing difficulty breathing, which she attributes to the fluctuating weather conditions, with temperatures reaching 74 degrees, unusual for winter. She is currently using Arnuity and Singulair for asthma management. Arnuity remains covered by her insurance. She has not been on prednisone. She has not been rationing her inhaler due to the cost.  Allergic Rhinitis Symptom History: She remains on Singulair and Claritin.  This seems to work well to control her postnasal drip and allergic rhinitis.  Food Allergy Symptom History: She continues to avoid shellfish.  EpiPen is up-to-date.  Skin Symptom History: She mentions a rash that she has been monitoring, which is mostly resolved. She applies vitamin E oil to the area and has not been using any prescribed cream for it.  Her family history includes heart  disease. Her oldest son, residing in [place] state, has been diagnosed with heart disease and is on disability due to arthritis and back issues. Her youngest son had heart surgery at 26 old. She acknowledges that heart disease runs in her family.   She wants to do some traveling after she retires.  She is looking into moving to [place].  Otherwise, there have been no changes to her past  medical history, surgical history, family history, or social history.    Review of systems otherwise negative other than that mentioned in the HPI.    Objective:   Blood pressure (!) 158/86, pulse 65, temperature 98.3 F (36.8 C), temperature source Temporal, resp. rate 16, SpO2 98%. There is no height or weight on file to calculate BMI.    Physical Exam Vitals reviewed.  Constitutional:      Appearance: She is well-developed.     Comments: Pleasant.  Cooperative.  HENT:     Head: Normocephalic and atraumatic.     Right Ear: Tympanic membrane, ear canal and external ear normal. No drainage, swelling or tenderness. Tympanic membrane is not injected, scarred, erythematous, retracted or bulging.     Left Ear: Tympanic membrane, ear canal and external ear normal. No drainage, swelling or tenderness. Tympanic membrane is not injected, scarred, erythematous, retracted or bulging.     Nose: No nasal deformity, septal deviation, mucosal edema or rhinorrhea.     Right Turbinates: Enlarged, swollen and pale.     Left Turbinates: Enlarged, swollen and pale.     Right Sinus: No maxillary sinus tenderness or frontal sinus tenderness.     Left Sinus: No maxillary sinus tenderness or frontal sinus tenderness.     Comments: No nasal polyps noted. No septal deviation.     Mouth/Throat:     Lips: Pink.     Mouth: Mucous membranes are moist. Mucous membranes are not pale and not dry.     Pharynx: Uvula midline.  Eyes:     General: Allergic shiner present.        Right eye: No discharge.        Left eye: No discharge.     Conjunctiva/sclera: Conjunctivae normal.     Right eye: Right conjunctiva is not injected. No chemosis.    Left eye: Left conjunctiva is not injected. No chemosis.    Pupils: Pupils are equal, round, and reactive to light.  Cardiovascular:     Rate and Rhythm: Normal rate and regular rhythm.     Heart sounds: Normal heart sounds.  Pulmonary:     Effort: Pulmonary effort  is normal. No tachypnea, accessory muscle usage or respiratory distress.     Breath sounds: Normal breath sounds. No wheezing, rhonchi or rales.     Comments: Coarse upper airway sounds.  Chest:     Chest wall: No tenderness.  Abdominal:     Tenderness: There is no abdominal tenderness. There is no guarding or rebound.  Lymphadenopathy:     Head:     Right side of head: No submandibular, tonsillar or occipital adenopathy.     Left side of head: No submandibular, tonsillar or occipital adenopathy.     Cervical: No cervical adenopathy.  Skin:    General: Skin is warm.     Capillary Refill: Capillary refill takes less than 2 seconds.     Coloration: Skin is not jaundiced or pale.     Findings: No abrasion, erythema, petechiae or rash. Rash is not papular, urticarial or vesicular.  Comments: Confluent, hyperpigmented lesion approximately 6 inches in diameter around the left antecubital fossa.   Neurological:     Mental Status: She is alert.  Psychiatric:        Behavior: Behavior is cooperative.      Diagnostic studies:    Spirometry: Normal FEV1, FVC, and FEV1/FVC ratio. There is no scooping suggestive of obstructive disease.       Allergy Studies: none       Malachi Bonds, MD  [facility]

## 2023-10-02 NOTE — Patient Instructions (Addendum)
1. Cough - Lung testing looks AMAZING!  - Let us know if the inhalers become too expensive. - Daily controller medication(s): Arnuity one puff once daily + Singulair 10mg   - Rescue medications: albuterol 4 puffs every 4-6 hours as needed  - Asthma control goals:  * Full participation in all desired activities (may need albuterol before activity) * Albuterol use two time or less a week on average (not counting use with activity) * Cough interfering with sleep two time or less a month * Oral steroids no more than once a year * No hospitalizations  2. Chronic non- rhinitis - Continue with the Singulair 10mg  daily. - Continue with Claritin twice daily.  3. Rash - Continue with the vitamin E oil.   4. Return in about 6 months (around 03/31/2024). You can have the follow up appointment with Dr. Dellis Anes or a Nurse Practicioner (our Nurse Practitioners are excellent and always have Physician oversight!).    Please inform us of any Emergency Department visits, hospitalizations, or changes in symptoms. Call us before going to the ED for breathing or allergy symptoms since we might be able to fit you in for a sick visit. Feel free to contact us anytime with any questions, problems, or concerns.  It was a pleasure to see you again today!  Websites that have reliable patient information: 1. American Academy of Asthma, Allergy, and Immunology: www.aaaai.org 2. Food Allergy Research and Education (FARE): foodallergy.org 3. Mothers of Asthmatics: http://www.asthmacommunitynetwork.org 4. American College of Allergy, Asthma, and Immunology: www.acaai.org      "Like" [place] on Facebook and Instagram for our latest updates!      A healthy democracy works best when [employer] participate! Make sure you are registered to vote! If you have moved or changed any of your contact information, you will need to get this updated before voting! Scan the QR codes below to learn more!

## 2023-12-28 ENCOUNTER — Other Ambulatory Visit: Payer: Self-pay | Admitting: Allergy & Immunology

## 2024-02-04 ENCOUNTER — Encounter: Admitting: Family

## 2024-02-06 ENCOUNTER — Ambulatory Visit (INDEPENDENT_AMBULATORY_CARE_PROVIDER_SITE_OTHER): Admitting: Adult Health

## 2024-02-06 ENCOUNTER — Encounter: Payer: Self-pay | Admitting: Adult Health

## 2024-02-06 VITALS — BP 132/82 | HR 73 | Temp 97.3°F | Ht 67.0 in | Wt 186.6 lb

## 2024-02-06 DIAGNOSIS — Z6829 Body mass index (BMI) 29.0-29.9, adult: Secondary | ICD-10-CM

## 2024-02-06 DIAGNOSIS — Z1231 Encounter for screening mammogram for malignant neoplasm of breast: Secondary | ICD-10-CM

## 2024-02-06 DIAGNOSIS — R7303 Prediabetes: Secondary | ICD-10-CM | POA: Diagnosis not present

## 2024-02-06 DIAGNOSIS — Z0000 Encounter for general adult medical examination without abnormal findings: Secondary | ICD-10-CM

## 2024-02-06 DIAGNOSIS — Z1212 Encounter for screening for malignant neoplasm of rectum: Secondary | ICD-10-CM

## 2024-02-06 DIAGNOSIS — E782 Mixed hyperlipidemia: Secondary | ICD-10-CM

## 2024-02-06 DIAGNOSIS — J4540 Moderate persistent asthma, uncomplicated: Secondary | ICD-10-CM

## 2024-02-06 DIAGNOSIS — Z1211 Encounter for screening for malignant neoplasm of colon: Secondary | ICD-10-CM

## 2024-02-06 DIAGNOSIS — Z13820 Encounter for screening for osteoporosis: Secondary | ICD-10-CM

## 2024-02-06 NOTE — Progress Notes (Unsigned)
 [facility] clinic  Provider:  Inge Mangle DNP  Code Status:  Full Code  Goals of Care:     09/27/2022    9:09 AM  Advanced Directives  Does Patient Have a Medical Advance Directive? No  Would patient like information on creating a medical advance directive? No - Patient declined     Chief Complaint  Patient presents with   Annual Exam    PHYSICAL  Discuss Dtap , shingles , COVID  Bone density and cologuard     Discussed the use of AI scribe software for clinical note transcription with the patient, who gave verbal consent to proceed.  HPI: Patient is a 68 y.o. female seen today for a physical exam.     Past Medical History:  Diagnosis Date   Asthma    Ovarian cancer [facility])     Past Surgical History:  Procedure Laterality Date   BREAST LUMPECTOMY     COLONOSCOPY  2021   Dr. Lajuana Pilar, per new patient form   DIAGNOSTIC MAMMOGRAM  2021   Dr. Jamal Mays, Per new patient forms   PARTIAL HYSTERECTOMY  1999   Dr. Lesta Rater    Allergies  Allergen Reactions   Shellfish Allergy  Anaphylaxis    Itching/swelling   Benadryl [Diphenhydramine] Hives   Other Cough    Strong Prefumes/Cologne.     Outpatient Encounter Medications as of 02/06/2024  Medication Sig   albuterol  (VENTOLIN  HFA) 108 (90 Base) MCG/ACT inhaler Inhale 2 puffs into the lungs every 4 (four) hours as needed for wheezing or shortness of breath.   ARNUITY ELLIPTA  200 MCG/ACT AEPB Take 1 puff by mouth daily.   loratadine  (CLARITIN ) 10 MG tablet TAKE 1 TABLET BY MOUTH 1 OR 2 TIMES DAILY   montelukast  (SINGULAIR ) 10 MG tablet Take 1 tablet (10 mg total) by mouth at bedtime.   Omega-3 Fatty Acids (FISH OIL) 1000 MG CAPS Take 2 capsules by mouth daily.   Vitamin E Skin OIL Apply topically.   montelukast  (SINGULAIR ) 10 MG tablet Take 1 tablet (10 mg total) by mouth at bedtime. (Patient not taking: Reported on 02/06/2024)   OVER THE COUNTER MEDICATION Apply 1 application topically daily. [employer], [employer], and Essential Oils. (Patient not taking: Reported on 02/06/2024)   No facility-administered encounter medications on file as of 02/06/2024.    Review of Systems:  Review of Systems  Constitutional:  Negative for appetite change, chills, fatigue and fever.  HENT:  Negative for congestion, hearing loss, rhinorrhea and sore throat.   Eyes: Negative.   Respiratory:  Negative for cough, shortness of breath and wheezing.   Cardiovascular:  Negative for chest pain, palpitations and leg swelling.  Gastrointestinal:  Negative for abdominal pain, constipation, diarrhea, nausea and vomiting.  Genitourinary:  Negative for dysuria.  Musculoskeletal:  Negative for arthralgias, back pain and myalgias.  Skin:  Negative for color change, rash and wound.  Neurological:  Negative for dizziness, weakness and headaches.  Psychiatric/Behavioral:  Negative for behavioral problems. The patient is not nervous/anxious.     Health Maintenance  Topic Date Due   DEXA SCAN  Never done   Fecal DNA (Cologuard)  02/22/2023   COVID-19 Vaccine (7 - 2024-25 season) 02/22/2024 (Originally 04/20/2023)   Zoster Vaccines- Shingrix (2 of 2) 05/08/2024 (Originally 03/09/2022)   DTaP/Tdap/Td (1 - Tdap) 02/05/2025 (Originally 09/19/1998)   INFLUENZA VACCINE  03/19/2024   MAMMOGRAM  11/21/2024   Pneumococcal Vaccine: 50+ Years  Completed   Hepatitis C  Screening  Completed   HPV VACCINES  Aged Out   Meningococcal B Vaccine  Aged Out   Colonoscopy  Discontinued    Physical Exam: Vitals:   02/06/24 1101  BP: 132/82  Pulse: 73  Temp: (!) 97.3 F (36.3 C)  SpO2: 97%  Weight: 186 lb 9.6 oz (84.6 kg)  Height: 5' 7 (1.702 m)   Body mass index is 29.23 kg/m. Physical Exam Constitutional:      Appearance: Normal appearance.  HENT:     Head: Normocephalic and atraumatic.     Nose: Nose normal.     Mouth/Throat:     Mouth: Mucous membranes are moist.   Eyes:     Conjunctiva/sclera: Conjunctivae  normal.    Cardiovascular:     Rate and Rhythm: Normal rate and regular rhythm.  Pulmonary:     Effort: Pulmonary effort is normal.     Breath sounds: Normal breath sounds.  Abdominal:     General: Bowel sounds are normal.     Palpations: Abdomen is soft.   Musculoskeletal:        General: Normal range of motion.     Cervical back: Normal range of motion.     Right lower leg: Edema present.     Left lower leg: Edema present.     Comments: BLE 2+edema   Skin:    General: Skin is warm and dry.   Neurological:     General: No focal deficit present.     Mental Status: She is alert and oriented to person, place, and time.   Psychiatric:        Mood and Affect: Mood normal.        Behavior: Behavior normal.        Thought Content: Thought content normal.        Judgment: Judgment normal.     Labs reviewed: Basic Metabolic Panel: No results for input(s): NA, K, CL, CO2, GLUCOSE, BUN, CREATININE, CALCIUM, MG, PHOS, TSH in the last 8760 hours. Liver Function Tests: No results for input(s): AST, ALT, ALKPHOS, BILITOT, PROT, ALBUMIN in the last 8760 hours. No results for input(s): LIPASE, AMYLASE in the last 8760 hours. No results for input(s): AMMONIA in the last 8760 hours. CBC: No results for input(s): WBC, NEUTROABS, HGB, HCT, MCV, PLT in the last 8760 hours. Lipid Panel: No results for input(s): CHOL, HDL, LDLCALC, TRIG, CHOLHDL, LDLDIRECT in the last 8760 hours. Lab Results  Component Value Date   HGBA1C 6.1 (H) 09/27/2022    Procedures since last visit: No results found.  Assessment/Plan   Labs/tests ordered:   CBC, CMP, Lipid panel, A1C, tsh   No follow-ups on file.  Colby Catanese Medina-Vargas, NP

## 2024-02-06 NOTE — Progress Notes (Signed)
   This encounter was created in error - please disregard. No show

## 2024-02-07 LAB — COMPLETE METABOLIC PANEL WITHOUT GFR
AG Ratio: 1.8 (calc) (ref 1.0–2.5)
ALT: 8 U/L (ref 6–29)
AST: 13 U/L (ref 10–35)
Albumin: 4.4 g/dL (ref 3.6–5.1)
Alkaline phosphatase (APISO): 79 U/L (ref 37–153)
BUN: 9 mg/dL (ref 7–25)
CO2: 27 mmol/L (ref 20–32)
Calcium: 9.6 mg/dL (ref 8.6–10.4)
Chloride: 105 mmol/L (ref 98–110)
Creat: 0.92 mg/dL (ref 0.50–1.05)
Globulin: 2.4 g/dL (ref 1.9–3.7)
Glucose, Bld: 98 mg/dL (ref 65–99)
Potassium: 4.2 mmol/L (ref 3.5–5.3)
Sodium: 142 mmol/L (ref 135–146)
Total Bilirubin: 0.4 mg/dL (ref 0.2–1.2)
Total Protein: 6.8 g/dL (ref 6.1–8.1)

## 2024-02-07 LAB — CBC WITH DIFFERENTIAL/PLATELET
Absolute Lymphocytes: 1726 {cells}/uL (ref 850–3900)
Absolute Monocytes: 239 {cells}/uL (ref 200–950)
Basophils Absolute: 32 {cells}/uL (ref 0–200)
Basophils Relative: 0.5 %
Eosinophils Absolute: 82 {cells}/uL (ref 15–500)
Eosinophils Relative: 1.3 %
HCT: 40.4 % (ref 35.0–45.0)
Hemoglobin: 12.9 g/dL (ref 11.7–15.5)
MCH: 28.4 pg (ref 27.0–33.0)
MCHC: 31.9 g/dL — ABNORMAL LOW (ref 32.0–36.0)
MCV: 88.8 fL (ref 80.0–100.0)
MPV: 11.2 fL (ref 7.5–12.5)
Monocytes Relative: 3.8 %
Neutro Abs: 4221 {cells}/uL (ref 1500–7800)
Neutrophils Relative %: 67 %
Platelets: 185 10*3/uL (ref 140–400)
RBC: 4.55 10*6/uL (ref 3.80–5.10)
RDW: 12.3 % (ref 11.0–15.0)
Total Lymphocyte: 27.4 %
WBC: 6.3 10*3/uL (ref 3.8–10.8)

## 2024-02-07 LAB — TSH: TSH: 1.69 m[IU]/L (ref 0.40–4.50)

## 2024-02-07 LAB — LIPID PANEL
Cholesterol: 245 mg/dL — ABNORMAL HIGH (ref <200)
HDL: 54 mg/dL (ref >=50)
LDL Cholesterol (Calc): 173 mg/dL — ABNORMAL HIGH
Non-HDL Cholesterol (Calc): 191 mg/dL — ABNORMAL HIGH (ref <130)
Total CHOL/HDL Ratio: 4.5 (calc) (ref <5.0)
Triglycerides: 74 mg/dL (ref <150)

## 2024-02-07 LAB — HEMOGLOBIN A1C
Hgb A1c MFr Bld: 6.1 % — ABNORMAL HIGH (ref <5.7)
Mean Plasma Glucose: 128 mg/dL
eAG (mmol/L): 7.1 mmol/L

## 2024-02-08 ENCOUNTER — Ambulatory Visit: Payer: Self-pay | Admitting: Adult Health

## 2024-02-08 NOTE — Progress Notes (Signed)
-   Electrolytes, liver enzymes, TSH, all within normal -   No anemia -   Cholesterol 245, down from 253 a year ago - LDL 173,   down from 172, a year ago -Recommending to continue exercising for at least 150 minutes/week and low-fat diet - Recommending statin, if she wants then just let us  know

## 2024-02-09 NOTE — Progress Notes (Signed)
 Noted

## 2024-02-24 ENCOUNTER — Ambulatory Visit
Admission: RE | Admit: 2024-02-24 | Discharge: 2024-02-24 | Disposition: A | Discharge status: Home / Self Care | Source: Ambulatory Visit | Attending: Adult Health | Admitting: Adult Health

## 2024-03-16 LAB — COLOGUARD: COLOGUARD: NEGATIVE

## 2024-03-16 NOTE — Progress Notes (Signed)
 Cologuard negative for colorectal cancer

## 2024-03-21 ENCOUNTER — Other Ambulatory Visit: Payer: Self-pay | Admitting: Allergy & Immunology

## 2024-04-01 ENCOUNTER — Other Ambulatory Visit: Payer: Self-pay

## 2024-04-01 ENCOUNTER — Encounter: Payer: Self-pay | Admitting: Allergy & Immunology

## 2024-04-01 ENCOUNTER — Ambulatory Visit: Payer: 59 | Admitting: Allergy & Immunology

## 2024-04-01 VITALS — BP 130/80 | HR 77 | Temp 98.1°F | Resp 18 | Ht 66.14 in | Wt 192.2 lb

## 2024-04-01 DIAGNOSIS — J4540 Moderate persistent asthma, uncomplicated: Secondary | ICD-10-CM

## 2024-04-01 DIAGNOSIS — T7802XD Anaphylactic reaction due to shellfish (crustaceans), subsequent encounter: Secondary | ICD-10-CM | POA: Diagnosis not present

## 2024-04-01 DIAGNOSIS — R21 Rash and other nonspecific skin eruption: Secondary | ICD-10-CM

## 2024-04-01 MED ORDER — TRELEGY ELLIPTA 100-62.5-25 MCG/ACT IN AEPB: 1.0000 | INHALATION_SPRAY | Freq: Every morning | RESPIRATORY_TRACT | 1 refills | Status: DC

## 2024-04-01 MED ORDER — LORATADINE 10 MG PO TABS: 10.0000 mg | ORAL_TABLET | Freq: Two times a day (BID) | ORAL | 1 refills | Status: DC

## 2024-04-01 MED ORDER — MONTELUKAST SODIUM 10 MG PO TABS: 10.0000 mg | ORAL_TABLET | Freq: Every evening | ORAL | 1 refills | Status: DC

## 2024-04-01 MED ORDER — AIRSUPRA 90-80 MCG/ACT IN AERO: 2.0000 | INHALATION_SPRAY | RESPIRATORY_TRACT | 2 refills | Status: AC | PRN

## 2024-04-01 NOTE — Progress Notes (Signed)
 FOLLOW UP  Date of Service/Encounter:  04/01/24   Assessment:   Moderate persistent asthma, uncomplicated   Non-allergic rhinitis    Rash - resolved    Shellfish allergy    [employer] filet-[employer]-fish lover   Lover of westerns   [employer] maker  Plan/Recommendations:   1. Cough - Lung testing looked amazing today.  - We are going to add on Trelegy, which contains THREE medications to help with your breathing. - Samples provided today. - Call us  if you like it and we can send in the medication. - We are also starting AirSupra , which contains an albuterol  combined with an inhaled steroid which helps the lungs to use albuterol  more effectively.  - AirSupra  can also decrease the use of systemic steroids in the long-term.  - Daily controller medication(s): Trelegy 100/62.5/25mcg one puff once daily - Prior to physical activity: AirSupra  2 puffs 10-15 minutes before physical activity. - Rescue medications: AirSupra  2 puffs every 4-6 hours as needed - Asthma control goals:  * Full participation in all desired activities (may need albuterol  before activity) * Albuterol  use two time or less a week on average (not counting use with activity) * Cough interfering with sleep two time or less a month * Oral steroids no more than once a year * No hospitalizations  2. Chronic non-allergic rhinitis - Continue with the Singulair  10mg  daily. - Continue with Claritin  twice daily.  3. Rash - Continue with the vitamin E oil as needed.   4. Return in about 3 months (around 07/02/2024). You can have the follow up appointment with Dr. Iva or a Nurse Practicioner (our Nurse Practitioners are excellent and always have Physician oversight!).   Subjective:   Rhonda Kirk is a 68 y.o. female presenting today for follow up of  Chief Complaint  Patient presents with   Follow-up    Rhonda Kirk has a history of the following: Patient Active Problem List   Diagnosis Date Noted    Prediabetes 10/07/2022   Moderate persistent asthma, uncomplicated 09/21/2021   BMI 29.0-29.9,adult 09/21/2021   Overweight with body mass index (BMI) 25.0-29.9 09/21/2021   Hyperlipidemia LDL goal <100 09/21/2021   History of ovarian cancer 09/21/2021   Cough 03/02/2020   Non-allergic rhinitis 03/02/2020    History obtained from: chart review and patient.  Discussed the use of AI scribe software for clinical note transcription with the patient and/or guardian, who gave verbal consent to proceed.  Rhonda Kirk is a 68 y.o. female presenting for a follow up visit.  She was last seen in February 2025.  At that time, when testing looked amazing.  We continue with Arnuity 1 puff once daily as well as Singulair .  She also remain on albuterol  as needed.  For her nonallergic rhinitis, we continued withed Singulair  10 mg daily and Claritin  twice daily.  Since the last visit,   Asthma/Respiratory Symptom History: She has experienced a worsening of her asthma symptoms since June, characterized by severe coughing attacks that cause chest pain. These episodes often occur at night, waking her up and making it difficult to stop coughing. She uses albuterol , taking four puffs during these episodes, but experiences significant jitteriness as a side effect. She continues to use Arnuity as part of her asthma management.  Overall, her asthma symptoms seem to gotten worse since we saw her last time. Her asthma symptoms have been particularly bad since June. She has not been on Breo or Trelegy before. She mentions that her symptoms do not  worsen while baking, which she finds relaxing.  She recalls a coworker who passed away from an asthma attack, which has heightened her awareness of her own condition.   Allergic Rhinitis Symptom History: She remains on Singulair  as well as Claritin .  This combination seems to be working well. She has never been on allergy  shots.  Skin Symptom History: Rash is under good control.  She  has a few breakouts in her antecubital fossa, but nothing too severe.  She works in a stressful job and is involved in Tree [employer], which she finds relaxing. She is currently dealing with financial and housing challenges following a divorce.  Otherwise, there have been no changes to her past medical history, surgical history, family history, or social history.    Review of systems otherwise negative other than that mentioned in the HPI.    Objective:   Blood pressure 130/80, pulse 77, temperature 98.1 F (36.7 C), temperature source Temporal, resp. rate 18, height 5' 6.14 (1.68 m), weight 192 lb 3.2 oz (87.2 kg), SpO2 98%. Body mass index is 30.89 kg/m.    Physical Exam Vitals reviewed.  Constitutional:      Appearance: She is well-developed.     Comments: Pleasant.  Cooperative.  HENT:     Head: Normocephalic and atraumatic.     Right Ear: Tympanic membrane, ear canal and external ear normal. No drainage, swelling or tenderness. Tympanic membrane is not injected, scarred, erythematous, retracted or bulging.     Left Ear: Tympanic membrane, ear canal and external ear normal. No drainage, swelling or tenderness. Tympanic membrane is not injected, scarred, erythematous, retracted or bulging.     Nose: No nasal deformity, septal deviation, mucosal edema or rhinorrhea.     Right Turbinates: Enlarged, swollen and pale.     Left Turbinates: Enlarged, swollen and pale.     Right Sinus: No maxillary sinus tenderness or frontal sinus tenderness.     Left Sinus: No maxillary sinus tenderness or frontal sinus tenderness.     Comments: No nasal polyps noted. No septal deviation.     Mouth/Throat:     Lips: Pink.     Mouth: Mucous membranes are moist. Mucous membranes are not pale and not dry.     Pharynx: Uvula midline.  Eyes:     General: Allergic shiner present.        Right eye: No discharge.        Left eye: No discharge.     Conjunctiva/sclera: Conjunctivae normal.     Right eye:  Right conjunctiva is not injected. No chemosis.    Left eye: Left conjunctiva is not injected. No chemosis.    Pupils: Pupils are equal, round, and reactive to light.  Cardiovascular:     Rate and Rhythm: Normal rate and regular rhythm.     Heart sounds: Normal heart sounds.  Pulmonary:     Effort: Pulmonary effort is normal. No tachypnea, accessory muscle usage or respiratory distress.     Breath sounds: Normal breath sounds. No wheezing, rhonchi or rales.     Comments: Coarse upper airway sounds.  Chest:     Chest wall: No tenderness.  Abdominal:     Tenderness: There is no abdominal tenderness. There is no guarding or rebound.  Lymphadenopathy:     Head:     Right side of head: No submandibular, tonsillar or occipital adenopathy.     Left side of head: No submandibular, tonsillar or occipital adenopathy.     Cervical: No cervical  adenopathy.  Skin:    General: Skin is warm.     Capillary Refill: Capillary refill takes less than 2 seconds.     Coloration: Skin is not jaundiced or pale.     Findings: No abrasion, erythema, petechiae or rash. Rash is not papular, urticarial or vesicular.     Comments: Confluent, hyperpigmented lesion approximately 6 inches in diameter around the left antecubital fossa.   Neurological:     Mental Status: She is alert.  Psychiatric:        Behavior: Behavior is cooperative.      Diagnostic studies:    Spirometry: results normal (FEV1: 1.71/78%, FVC: 2.29/82%, FEV1/FVC: 75%).    Spirometry consistent with normal pattern.   Allergy  Studies: none      Marty Shaggy, MD  [facility]  

## 2024-04-01 NOTE — Addendum Note (Signed)
 Addended by: JENEL MARYLYNN GRADE on: 04/01/2024 03:25 PM   Modules accepted: Orders

## 2024-04-01 NOTE — Patient Instructions (Addendum)
 1. Cough - Lung testing looked amazing today.  - We are going to add on Trelegy, which contains THREE medications to help with your breathing. - Samples provided today. - Call us  if you like it and we can send in the medication. - We are also starting AirSupra , which contains an albuterol  combined with an inhaled steroid which helps the lungs to use albuterol  more effectively.  - AirSupra  can also decrease the use of systemic steroids in the long-term.  - Daily controller medication(s): Trelegy 100/62.5/25mcg one puff once daily - Prior to physical activity: AirSupra  2 puffs 10-15 minutes before physical activity. - Rescue medications: AirSupra  2 puffs every 4-6 hours as needed - Asthma control goals:  * Full participation in all desired activities (may need albuterol  before activity) * Albuterol  use two time or less a week on average (not counting use with activity) * Cough interfering with sleep two time or less a month * Oral steroids no more than once a year * No hospitalizations  2. Chronic non-allergic rhinitis - Continue with the Singulair  10mg  daily. - Continue with Claritin  twice daily.  3. Rash - Continue with the vitamin E oil as needed.   4. Return in about 3 months (around 07/02/2024). You can have the follow up appointment with Dr. Iva or a Nurse Practicioner (our Nurse Practitioners are excellent and always have Physician oversight!).    Please inform us  of any Emergency Department visits, hospitalizations, or changes in symptoms. Call us  before going to the ED for breathing or allergy  symptoms since we might be able to fit you in for a sick visit. Feel free to contact us  anytime with any questions, problems, or concerns.  It was a pleasure to see you again today!  Websites that have reliable patient information: 1. American Academy of Asthma, Allergy , and Immunology: www.aaaai.org 2. Food Allergy  Research and Education (FARE): foodallergy.org 3. Mothers of  Asthmatics: http://www.asthmacommunitynetwork.org 4. American College of Allergy , Asthma, and Immunology: www.acaai.org      "Like" us  on Facebook and Instagram for our latest updates!      A healthy democracy works best when [employer] participate! Make sure you are registered to vote! If you have moved or changed any of your contact information, you will need to get this updated before voting! Scan the QR codes below to learn more!

## 2024-05-10 ENCOUNTER — Encounter: Payer: Self-pay | Admitting: Allergy & Immunology

## 2024-05-12 MED ORDER — COVID-19 MRNA VAC-TRIS(PFIZER) 30 MCG/0.3ML IM SUSY: 0.3000 mL | PREFILLED_SYRINGE | Freq: Once | INTRAMUSCULAR | 0 refills | Status: AC

## 2024-06-01 ENCOUNTER — Other Ambulatory Visit: Payer: Self-pay | Admitting: Allergy & Immunology

## 2024-07-02 ENCOUNTER — Ambulatory Visit: Admitting: Internal Medicine

## 2024-07-02 ENCOUNTER — Other Ambulatory Visit: Payer: Self-pay

## 2024-07-02 ENCOUNTER — Encounter: Payer: Self-pay | Admitting: Internal Medicine

## 2024-07-02 VITALS — BP 120/82 | HR 75 | Temp 97.4°F | Ht 65.0 in | Wt 195.3 lb

## 2024-07-02 DIAGNOSIS — T7802XD Anaphylactic reaction due to shellfish (crustaceans), subsequent encounter: Secondary | ICD-10-CM

## 2024-07-02 DIAGNOSIS — J310 Chronic rhinitis: Secondary | ICD-10-CM

## 2024-07-02 DIAGNOSIS — J4540 Moderate persistent asthma, uncomplicated: Secondary | ICD-10-CM

## 2024-07-02 MED ORDER — EPINEPHRINE 0.3 MG/0.3ML IJ SOAJ: 0.3000 mg | INTRAMUSCULAR | 1 refills | Status: AC | PRN

## 2024-07-02 MED ORDER — MONTELUKAST SODIUM 10 MG PO TABS: 10.0000 mg | ORAL_TABLET | Freq: Every day | ORAL | 1 refills | Status: AC

## 2024-07-02 MED ORDER — LORATADINE 10 MG PO TABS: 10.0000 mg | ORAL_TABLET | Freq: Every day | ORAL | 1 refills | Status: AC

## 2024-07-02 MED ORDER — ALBUTEROL SULFATE HFA 108 (90 BASE) MCG/ACT IN AERS: 1.0000 | INHALATION_SPRAY | Freq: Four times a day (QID) | RESPIRATORY_TRACT | 1 refills | Status: AC | PRN

## 2024-07-02 NOTE — Patient Instructions (Signed)
 Moderate Persistent Asthma: - Maintenance: Use Singulair  10mg  daily.  Stop if you notice any mood/behavioral changes.   - Rescue inhaler: Albuterol  or Airsupra  2 puffs via spacer or 1 vial via nebulizer every 4-6 hours as needed for respiratory symptoms of cough, shortness of breath, or wheezing Asthma control goals:  Full participation in all desired activities (may need albuterol  before activity) Albuterol  use two times or less a week on average (not counting use with activity) Cough interfering with sleep two times or less a month Oral steroids no more than once a year No hospitalizations  Chronic Rhinitis - Positive skin test 2021: none - Use nasal saline rinses before nose sprays such as with Neilmed Sinus Rinse.  Use distilled water.   - Use Claritin  10 mg daily.   Food Allergy :  - please strictly avoid shellfish.  - for SKIN only reaction, okay to take Claritin  10 mg every 12 hours as needed - for SKIN + ANY additional symptoms, OR IF concern for LIFE THREATENING reaction = Epipen Autoinjector EpiPen 0.3 mg - If using Epinephrine autoinjector, call 911 or go to the ER.

## 2024-07-02 NOTE — Addendum Note (Signed)
 Addended by: Taylar Hartsough on: 07/02/2024 04:29 PM   Modules accepted: Orders

## 2024-07-02 NOTE — Progress Notes (Signed)
 FOLLOW UP Date of Service/Encounter:  07/02/24   Subjective:  Rhonda Kirk (DOB: August 27, 1955) is a 68 y.o. female who [facility] for follow up for chronic rhinitis, shellfish allergy , asthma.   History obtained from: chart review and patient. Last seen on 04/01/2024 with Dr Iva and discussed adding Trelegy for cough; also on Claritin /Singulair  for rhinitis. Avoiding shellfish.  Since last visit, reports cough is doing a lot better with clear lung supplement and lozenges.  No trouble with wheezing/dyspnea.  She does not like taking steroid inhalers. Has stopped using Trelegy and does not feel any different.  Keeps Airsupra  or Albuterol  PRN but has not used it.  No ER/urgent care/oral prednisone  use either. Taking Singulair , no mood/behavioral changes. Denies any hx of HTN or liver disease.   Denies significant trouble with congestion, runny nose, drainage. Using Claritin  daily.  Notes mouth tingling and facial swelling with shellfish ingestion in the past. Does not have an Epipen but would be interested in getting one.   Past Medical History: Past Medical History:  Diagnosis Date   Asthma    Ovarian cancer (HCC)     Objective:  BP 120/82 (BP Location: Right Arm, Patient Position: Sitting)   Pulse 75   Temp (!) 97.4 F (36.3 C) (Temporal)   Ht 5' 5 (1.651 m)   Wt 195 lb 4.8 oz (88.6 kg)   SpO2 98%   BMI 32.50 kg/m  Body mass index is 32.5 kg/m. Physical Exam: GEN: alert, well developed HEENT: clear conjunctiva, nose with mild inferior turbinate hypertrophy, pink nasal mucosa, clear rhinorrhea, + cobblestoning HEART: regular rate and rhythm, no murmur LUNGS: clear to auscultation bilaterally, no coughing, unlabored respiration SKIN: no rashes or lesions  Spirometry:  Tracings reviewed. Her effort: It was hard to get consistent efforts and there is a question as to whether this reflects a maximal maneuver. FVC: 2.28L, 81%  predicted  FEV1: 1.31L, 60% predicted FEV1/FVC ratio: 57% Interpretation: Spirometry consistent with moderate obstructive disease.  Please see scanned spirometry results for details.  Assessment:   1. Chronic rhinitis   2. Anaphylactic shock due to shellfish, subsequent encounter   3. Moderate persistent asthma, uncomplicated     Plan/Recommendations:  Moderate Persistent Asthma: - Spirometry today with obstruction but suboptimal effort.  Cough has resolved with lozenges + OTC supplements.  Self discontinued Trelegy; discussed okay to stay off Trelegy for now and see how she does.  - Maintenance: Use Singulair  10mg  daily.  Stop if you notice any mood/behavioral changes.   - Rescue inhaler: Albuterol  or Airsupra  2 puffs via spacer or 1 vial via nebulizer every 4-6 hours as needed for respiratory symptoms of cough, shortness of breath, or wheezing Asthma control goals:  Full participation in all desired activities (may need albuterol  before activity) Albuterol  use two times or less a week on average (not counting use with activity) Cough interfering with sleep two times or less a month Oral steroids no more than once a year No hospitalizations  Chronic Rhinitis - Controlled  - Positive skin test 2021: none - Use nasal saline rinses before nose sprays such as with Neilmed Sinus Rinse.  Use distilled water.   - Use Claritin  10 mg daily.   Food Allergy :  - please strictly avoid shellfish.  - Initial rxn: tingling and facial swelling - for SKIN only reaction, okay to take Claritin  10 mg every 12 hours as needed - for SKIN + ANY additional symptoms, OR  IF concern for LIFE THREATENING reaction = Epipen Autoinjector EpiPen 0.3 mg - If using Epinephrine autoinjector, call 911 or go to the ER.       Return in about 6 months (around 12/30/2024).  Arleta Blanch, MD [facility] [place] 

## 2025-01-06 ENCOUNTER — Ambulatory Visit: Admitting: Allergy & Immunology

## 2025-02-07 ENCOUNTER — Encounter: Payer: Self-pay | Admitting: Family
